# Patient Record
Sex: Male | Born: 1970 | Race: White | Hispanic: No | Marital: Married | Smoking: Never smoker
Health system: Southern US, Community
[De-identification: ages and names within clinical notes are randomized; demographics above are authoritative.]

## PROBLEM LIST (undated history)

## (undated) DIAGNOSIS — K219 Gastro-esophageal reflux disease without esophagitis: Secondary | ICD-10-CM

## (undated) HISTORY — PX: GANGLION CYST EXCISION: SHX1691

## (undated) HISTORY — PX: INGUINAL HERNIA REPAIR: SHX194

## (undated) HISTORY — PX: TENDON REPAIR: SHX5111

## (undated) HISTORY — DX: Gastro-esophageal reflux disease without esophagitis: K21.9

---

## 1998-11-24 ENCOUNTER — Emergency Department (HOSPITAL_COMMUNITY): Admission: EM | Admit: 1998-11-24 | Discharge: 1998-11-24 | Payer: Self-pay | Admitting: Emergency Medicine

## 1999-01-01 ENCOUNTER — Ambulatory Visit (HOSPITAL_COMMUNITY): Admission: RE | Admit: 1999-01-01 | Discharge: 1999-01-01 | Payer: Self-pay | Admitting: Internal Medicine

## 1999-01-01 ENCOUNTER — Encounter: Payer: Self-pay | Admitting: Internal Medicine

## 2000-06-06 ENCOUNTER — Emergency Department (HOSPITAL_COMMUNITY): Admission: EM | Admit: 2000-06-06 | Discharge: 2000-06-06 | Payer: Self-pay | Admitting: Emergency Medicine

## 2006-03-19 ENCOUNTER — Ambulatory Visit: Payer: Self-pay | Admitting: Family Medicine

## 2006-03-23 ENCOUNTER — Emergency Department (HOSPITAL_COMMUNITY): Admission: EM | Admit: 2006-03-23 | Discharge: 2006-03-24 | Payer: Self-pay | Admitting: Emergency Medicine

## 2008-09-20 ENCOUNTER — Ambulatory Visit: Payer: Self-pay | Admitting: Family Medicine

## 2010-07-08 ENCOUNTER — Ambulatory Visit: Payer: Self-pay | Admitting: Family Medicine

## 2010-07-22 ENCOUNTER — Ambulatory Visit: Payer: Self-pay | Admitting: Family Medicine

## 2010-09-05 ENCOUNTER — Ambulatory Visit: Admit: 2010-09-05 | Payer: Self-pay | Admitting: Family Medicine

## 2010-09-05 ENCOUNTER — Encounter (INDEPENDENT_AMBULATORY_CARE_PROVIDER_SITE_OTHER): Payer: 59 | Admitting: Family Medicine

## 2010-09-05 DIAGNOSIS — Z0000 Encounter for general adult medical examination without abnormal findings: Secondary | ICD-10-CM

## 2010-09-05 DIAGNOSIS — F430 Acute stress reaction: Secondary | ICD-10-CM

## 2012-01-02 ENCOUNTER — Encounter: Payer: Self-pay | Admitting: Medical

## 2012-01-02 ENCOUNTER — Ambulatory Visit (INDEPENDENT_AMBULATORY_CARE_PROVIDER_SITE_OTHER): Payer: 59 | Admitting: Medical

## 2012-01-02 VITALS — BP 98/60 | HR 76 | Temp 98.4°F | Resp 16 | Wt 162.0 lb

## 2012-01-02 DIAGNOSIS — M5430 Sciatica, unspecified side: Secondary | ICD-10-CM

## 2012-01-02 DIAGNOSIS — M5431 Sciatica, right side: Secondary | ICD-10-CM

## 2012-01-02 MED ORDER — METHYLPREDNISOLONE 4 MG PO KIT: PACK | ORAL | Status: AC

## 2012-01-02 MED ORDER — HYDROCODONE-ACETAMINOPHEN 5-500 MG PO TABS: 1.0000 | ORAL_TABLET | Freq: Four times a day (QID) | ORAL | Status: AC | PRN

## 2012-01-02 NOTE — Patient Instructions (Signed)

## 2012-01-02 NOTE — Progress Notes (Signed)
Subjective: Here for "sciatic nerve pain."  He notes ever once and a while the nerve gets pinched and pain will shoot down to the feet.  This episode started last week, seemed to be ok over the weekend, but last few days flared up again. This has been an issue for years.  Seems to have worsened over the years.  This morning the whole right leg was jumping.  The pain in general this morning was so bad he felt nauseated.  He note hx/o 2 degenerative discs in lower back.  Has seen one of the orthopedics in town for this in past.  Last lumbar xrays 7 years ago. Had MRI 7 years ago.  When the pain flares up, he walks and keeps moving, working.  Using Tylenol, Excedrin, and in the last 2 days switched to Aleve.  Yesterday took a whole small bottle of Excedrin.  Walks all the time for exercise, is active, hunts, fishes, goes for 6mi walks at a time.  Stretches some in the mornings and at night.  Walking does aggravate the pain.    Review of Systems Constitutional: -fever, -chills, -sweats, -unexpected -weight change,-fatigue Gastroenterology: -abdominal pain, -nausea, -vomiting, -diarrhea, -constipation  Musculoskeletal: -arthralgias, -myalgias, -joint swelling Urology: -dysuria, -difficulty urinating, -hematuria, -urinary frequency, -urgency Neurology: -headache, -weakness, -tingling, -numbness, no bowel or bladder incontinence    Objective:   Physical Exam  Filed Vitals:   01/02/12 0851  BP: 98/60  Pulse: 76  Temp: 98.4 F (36.9 C)  Resp: 16    General appearance: alert, no distress, WD/WN, lean white male  Abdomen: +bs, soft, non tender, non distended, no masses, no hepatomegaly, no splenomegaly Pulses: 2+ symmetric, upper and lower extremities, normal cap refill Neuro: normal strength, sensation, DTRs, and - SLR Back: nontender, mild pain with flexion and extension, no scoliosis, pain radiates down right leg with back ROM MSK: tender along right sciatic notch and back of thigh, otherwise no  joint swelling, nontender, no obvious abnormality, normal LE ROM   Assessment and Plan :    Encounter Diagnosis  Name Primary?  . Sciatica, right Yes   Discussed diagnosis and treatment.  Script today for Medrol Dosepak, Lortab prn, relative rest, and recommended baseline xray but he declines.  Discussed possibly seeing ortho again for possible EDSI or return for further eval and management if this continues.

## 2012-02-24 ENCOUNTER — Other Ambulatory Visit: Payer: Self-pay | Admitting: Medical

## 2012-02-24 ENCOUNTER — Telehealth: Payer: Self-pay | Admitting: Internal Medicine

## 2012-02-24 MED ORDER — DICLOFENAC SODIUM 75 MG PO TBEC: 75.0000 mg | DELAYED_RELEASE_TABLET | Freq: Two times a day (BID) | ORAL | Status: DC

## 2012-02-24 NOTE — Telephone Encounter (Signed)
Patient was notified of the change in his medication. And to follow up with Kristian Covey PA-C if not better. CLS

## 2012-02-24 NOTE — Telephone Encounter (Signed)
Pull chart

## 2012-02-24 NOTE — Telephone Encounter (Signed)
Lets try a different approach.  i sent voltaren to pharmacy that he can begin twice daily for 5-7 days for sciatica flare up.  We discussed last time if not improving or if recurrence to consider other evaluation and management.   Thus, try the Voltaren, and if not improving, recheck.

## 2012-04-15 ENCOUNTER — Encounter: Payer: Self-pay | Admitting: Family Medicine

## 2012-04-15 ENCOUNTER — Ambulatory Visit (INDEPENDENT_AMBULATORY_CARE_PROVIDER_SITE_OTHER): Payer: 59 | Admitting: Family Medicine

## 2012-04-15 VITALS — BP 110/70 | HR 60 | Wt 156.0 lb

## 2012-04-15 DIAGNOSIS — L259 Unspecified contact dermatitis, unspecified cause: Secondary | ICD-10-CM

## 2012-04-15 DIAGNOSIS — M549 Dorsalgia, unspecified: Secondary | ICD-10-CM

## 2012-04-15 MED ORDER — TRAMADOL HCL 50 MG PO TABS: 50.0000 mg | ORAL_TABLET | Freq: Three times a day (TID) | ORAL | Status: AC | PRN

## 2012-04-15 NOTE — Progress Notes (Signed)
  Subjective:    Patient ID: Samuel Duncan, male    DOB: [birth year], 41 y.o.   MRN: [id number]  HPI He developed a rash on his left arm and abdomen 4 days ago. He has been using home remedies minimal success. He also complains of right-sided back pain with radiation down the right leg. He has a previous history of low back pain including having an MRI done approximately 7 or 8 years ago that apparently showed degenerated discs.   Review of Systems     Objective:   Physical Exam Alert and in no distress. Erythematous vesicular lesions noted on the distal left arm and hand. He also has several erythematous lesions present on the left abdominal wall. Exam of his back shows no tenderness to palpation with good motion. Normal hip motion. Negative straight leg raising with normal DTRs.       Assessment & Plan:   1. Contact dermatitis    2. Back pain  traMADol (ULTRAM) 50 MG tablet   recommend conservative care for the contact dermatitis with OTC cortisone preparation. He is to use NSAID first and then use, call if no improvement. He will return here if further difficulty

## 2012-04-15 NOTE — Patient Instructions (Signed)
Use the ibuprofen 4 pills 3 times a day and if you need extra help then the tramadol

## 2012-07-05 ENCOUNTER — Telehealth: Payer: Self-pay | Admitting: Internal Medicine

## 2012-07-05 MED ORDER — TRAMADOL HCL 50 MG PO TABS: 50.0000 mg | ORAL_TABLET | Freq: Three times a day (TID) | ORAL | Status: DC | PRN

## 2012-07-05 NOTE — Telephone Encounter (Signed)
Okay to renew. I did not show up in the medications reconciliation let

## 2012-07-05 NOTE — Telephone Encounter (Signed)
I do not see that we have prescribed tramadol. Check with him on that and possibly set up an appointment to be seen.

## 2012-07-05 NOTE — Telephone Encounter (Signed)
You did back in sept for back pain please advise

## 2012-07-05 NOTE — Telephone Encounter (Signed)
Med sent p[er jcl

## 2012-10-27 ENCOUNTER — Encounter: Payer: Self-pay | Admitting: Medical

## 2012-10-27 ENCOUNTER — Ambulatory Visit (INDEPENDENT_AMBULATORY_CARE_PROVIDER_SITE_OTHER): Payer: 59 | Admitting: Medical

## 2012-10-27 VITALS — BP 100/70 | HR 61 | Temp 98.4°F | Resp 16 | Wt 164.0 lb

## 2012-10-27 DIAGNOSIS — M5442 Lumbago with sciatica, left side: Secondary | ICD-10-CM

## 2012-10-27 DIAGNOSIS — M5430 Sciatica, unspecified side: Secondary | ICD-10-CM

## 2012-10-27 DIAGNOSIS — J019 Acute sinusitis, unspecified: Secondary | ICD-10-CM

## 2012-10-27 MED ORDER — AMOXICILLIN 875 MG PO TABS: 875.0000 mg | ORAL_TABLET | Freq: Two times a day (BID) | ORAL | Status: DC

## 2012-10-27 MED ORDER — TRAMADOL HCL 50 MG PO TABS: 50.0000 mg | ORAL_TABLET | Freq: Three times a day (TID) | ORAL | Status: DC | PRN

## 2012-10-27 MED ORDER — DICLOFENAC SODIUM 75 MG PO TBEC: 75.0000 mg | DELAYED_RELEASE_TABLET | Freq: Two times a day (BID) | ORAL | Status: DC

## 2012-10-27 NOTE — Patient Instructions (Signed)
Respiratory infection/sinus infection  Increase water intake  Begin Mucinex OTC twice daily for 5 days  Use nasal saline flush with bulb syringe - mix 1 tsp in 1 cup warm water to flush the nostril  If not improving by weekend, then begin Amoxicillin  Back:  Use stretching, gentle range of motion activity, Voltaren twice daily for 5-7 days with flare up.  ultram for pain when needed.

## 2012-10-27 NOTE — Progress Notes (Signed)
Subjective:  Here for possible sinus infection.  Been in [place] last 2 weeks, came back Friday.  Weather down there was wonderful, then coming back here to colder weather seemed to trigger something.  Last 3-4 days been having stuff nose, sinus pain, pressure, voice goes in an out, dry throat.  Body aches, ankles, knees and shoulders ache.  Has had chills and low grade fever last few days.  Coughing.  Eyes have been red, but not itchy.   Occasional sneeze.     He notes hx/o 3 sinus infections in past.    Sunday left leg hurting constantly, pain running down the leg.  Just got back from 14 hour road trip, working in [place], back has been flared up since.  No numbness, tingling, weakness.  No bowel or bladder incontinence.  No blood in urine, no fever.  He has had similar prior, seen here twice in last year for same.  Gets occasional flare up.     History reviewed. No pertinent past medical history.  ROS as in subjective  Objective: General appearance: alert, no distress, WD/WN, lean white male  HEENT: sinus tenderness bilat, yellow green thick mucus in nares, TMs pearly, pharynx normal Neck: supple, nontender, no mass, no thyromegaly Lungs: clear Abdomen: +bs, soft, non tender, non distended, no masses, no hepatomegaly, no splenomegaly  Pulses: 2+ symmetric, upper and lower extremities, normal cap refill  Neuro: normal strength, sensation, DTRs, and - SLR normal heel and toe walks Back: mild toe moderate left paraspinal and buttock tenderness, pain mild with flexion and extension, no scoliosis, pain radiates down left leg with back ROM  MSK: tender along left sciatic notch and back of thigh, otherwise no joint swelling, nontender, no obvious abnormality, normal LE ROM   Assessment: Encounter Diagnoses  Name Primary?  . Sinusitis, acute Yes  . Lumbago with sciatica of left side    Plan: Sinusitis - begin nasal saline flush, mucinex, hydration, rest, and if not improving by  Saturday/Sunday, can begin amoxicillin  Lumbago - acute and chronic.  Discussed stretches, ROM exercise, heat, relative rest, scripts as below

## 2013-02-07 ENCOUNTER — Encounter: Payer: Self-pay | Admitting: Medical

## 2013-02-07 ENCOUNTER — Ambulatory Visit (INDEPENDENT_AMBULATORY_CARE_PROVIDER_SITE_OTHER): Payer: 59 | Admitting: Medical

## 2013-02-07 VITALS — BP 90/60 | HR 68 | Temp 98.2°F | Resp 16 | Wt 158.0 lb

## 2013-02-07 DIAGNOSIS — L0100 Impetigo, unspecified: Secondary | ICD-10-CM

## 2013-02-07 DIAGNOSIS — T148 Other injury of unspecified body region: Secondary | ICD-10-CM

## 2013-02-07 DIAGNOSIS — W57XXXA Bitten or stung by nonvenomous insect and other nonvenomous arthropods, initial encounter: Secondary | ICD-10-CM

## 2013-02-07 MED ORDER — MUPIROCIN 2 % EX OINT: TOPICAL_OINTMENT | Freq: Three times a day (TID) | CUTANEOUS | Status: DC

## 2013-02-07 NOTE — Progress Notes (Signed)
Subjective: Here for possible tick bite infected.  pulled tick out from belly button Friday 3 days ago.  Was in deep.  Used fishing forceps to pull it out.   Not its red, has some pus coming out.  Area has been itchy as well.  Denies fever, no other rash, no arthralgias or myalgias otherwise.  Using nothing else for symptoms .  No other aggravating or relieving factors.    No past medical history on file.  ROS as in subjective  Objective: Gen: wd, wn, nad Skin: inferior portion of inner part of umbilicus with mild erythema, some crusted serous drainage, no obvious foreign body, there is a small wound suggestive of recent insect bite   Assessment: Encounter Diagnoses  Name Primary?  . Tick bite Yes  . Impetigo    Plan: Discussed cleaning the wound, drying the wound after shower, and using Mupirocin on cotton applicator TID x 1 wk.   discussed signs of cellulitis or worsening infection.  Call/return if worse or not improving.

## 2013-05-10 ENCOUNTER — Ambulatory Visit (INDEPENDENT_AMBULATORY_CARE_PROVIDER_SITE_OTHER): Payer: 59 | Admitting: Family Medicine

## 2013-05-10 ENCOUNTER — Encounter: Payer: Self-pay | Admitting: Family Medicine

## 2013-05-10 VITALS — BP 116/70 | HR 58 | Wt 159.0 lb

## 2013-05-10 DIAGNOSIS — M549 Dorsalgia, unspecified: Secondary | ICD-10-CM

## 2013-05-10 DIAGNOSIS — Z23 Encounter for immunization: Secondary | ICD-10-CM

## 2013-05-10 DIAGNOSIS — N4889 Other specified disorders of penis: Secondary | ICD-10-CM

## 2013-05-10 DIAGNOSIS — N489 Disorder of penis, unspecified: Secondary | ICD-10-CM

## 2013-05-10 NOTE — Progress Notes (Signed)
  Subjective:    Patient ID: Samuel Duncan, male    DOB: [birth year], 42 y.o.   MRN: [id number]  HPI He has a one-week history of low back pain especially on the left with radiation down his leg. No numbness, tingling or weakness but he does state it hurts when he walks. He also notes that whether changes can cause this. He also complains of redness at the urethral meatus. He noted this after sexual activity but no condom. It also occurred after he bought a new pair of boxers that did cause some irritation in that area    Review of Systems     Objective:   Physical Exam Alert and in no distress. Normal lumbar curve. No tenderness palpation over the SI joints or lumbar spine. Negative straight leg raising. Normal DTRs. He does point to the SI joint area on the left as to where he feels most discomfort when he hasn't. Exam of his penis does show a slightly irritated area in the urethral meatus the 10:00 position.       Assessment & Plan:  Need for prophylactic vaccination and inoculation against influenza  Penile lesion  Back pain  flu shot given with risks and benefits discussed. Recommend conservative care for the penile lesion. This is most likely from irritation. Discussed treatment of his back pain and recommend he return here when he has symptoms. This could easily be sacroiliitis

## 2013-05-10 NOTE — Patient Instructions (Addendum)
Keep doing stretching and range of motion and Billyjack't hesitate to take Advil or Aleve. The next time you have symptoms, come in for evaluation.

## 2013-08-11 ENCOUNTER — Encounter: Payer: Self-pay | Admitting: Family Medicine

## 2013-08-11 ENCOUNTER — Ambulatory Visit (INDEPENDENT_AMBULATORY_CARE_PROVIDER_SITE_OTHER): Payer: 59 | Admitting: Family Medicine

## 2013-08-11 VITALS — BP 102/70 | HR 75 | Wt 161.0 lb

## 2013-08-11 DIAGNOSIS — M549 Dorsalgia, unspecified: Secondary | ICD-10-CM

## 2013-08-11 MED ORDER — DICLOFENAC SODIUM 75 MG PO TBEC: 75.0000 mg | DELAYED_RELEASE_TABLET | Freq: Two times a day (BID) | ORAL | Status: AC

## 2013-08-11 NOTE — Patient Instructions (Signed)
Keep doing your heat, stretching and range of motion

## 2013-08-11 NOTE — Progress Notes (Signed)
   Subjective:    Patient ID: Samuel Duncan, male    DOB: March 20, 1971, 43 y.o.   MRN: [id number]  HPI He is here for evaluation of back pain. He has a long history of difficulty with back pain dating to his teenage years. In the past he apparently did have an MRI which did show some degenerative changes but it was recommended against doing surgery.  His latest episode started yesterday morning. He does get a tingling sensation in the left lateral leg area but no weakness or numbness. He has not taken any medications. In the past he has used to diclofenac with good results.   Review of Systems     Objective:   Physical Exam Alert and in no distress. No tenderness to palpation over the SI spine, lumbar spine or sciatic notch. Normal lumbar motion. Normal lumbar curve. Negative straight leg raising normal motor, sensory and DTRs. Normal hip and knee motion.      Assessment & Plan:  Back pain - Plan: diclofenac (VOLTAREN) 75 MG EC tablet  continue with conservative care. Recommend heat, stretching and range of motion exercises. He states that he has been doing this and I encouraged him to continue.

## 2014-02-14 ENCOUNTER — Encounter: Payer: 59 | Admitting: Family Medicine

## 2014-02-15 ENCOUNTER — Encounter: Payer: Self-pay | Admitting: Family Medicine

## 2014-02-15 ENCOUNTER — Ambulatory Visit (INDEPENDENT_AMBULATORY_CARE_PROVIDER_SITE_OTHER): Payer: 59 | Admitting: Family Medicine

## 2014-02-15 VITALS — BP 128/76 | HR 80 | Ht 72.0 in | Wt 155.0 lb

## 2014-02-15 DIAGNOSIS — G8929 Other chronic pain: Secondary | ICD-10-CM

## 2014-02-15 DIAGNOSIS — M549 Dorsalgia, unspecified: Secondary | ICD-10-CM

## 2014-02-15 DIAGNOSIS — M5430 Sciatica, unspecified side: Secondary | ICD-10-CM

## 2014-02-15 DIAGNOSIS — M5431 Sciatica, right side: Secondary | ICD-10-CM

## 2014-02-15 DIAGNOSIS — Z0000 Encounter for general adult medical examination without abnormal findings: Secondary | ICD-10-CM

## 2014-02-15 LAB — CBC WITH DIFFERENTIAL/PLATELET
BASOS PCT: 1 % (ref 0–1)
Basophils Absolute: 0.1 10*3/uL (ref 0.0–0.1)
EOS ABS: 0.1 10*3/uL (ref 0.0–0.7)
EOS PCT: 1 % (ref 0–5)
HCT: 41.5 % (ref 39.0–52.0)
Hemoglobin: 15.3 g/dL (ref 13.0–17.0)
LYMPHS ABS: 2.8 10*3/uL (ref 0.7–4.0)
Lymphocytes Relative: 27 % (ref 12–46)
MCH: 31 pg (ref 26.0–34.0)
MCHC: 36.9 g/dL — AB (ref 30.0–36.0)
MCV: 84 fL (ref 78.0–100.0)
MONOS PCT: 5 % (ref 3–12)
Monocytes Absolute: 0.5 10*3/uL (ref 0.1–1.0)
NEUTROS PCT: 66 % (ref 43–77)
Neutro Abs: 6.9 10*3/uL (ref 1.7–7.7)
PLATELETS: 311 10*3/uL (ref 150–400)
RBC: 4.94 MIL/uL (ref 4.22–5.81)
RDW: 13.4 % (ref 11.5–15.5)
WBC: 10.5 10*3/uL (ref 4.0–10.5)

## 2014-02-15 LAB — COMPREHENSIVE METABOLIC PANEL
ALK PHOS: 102 U/L (ref 39–117)
ALT: 15 U/L (ref 0–53)
AST: 25 U/L (ref 0–37)
Albumin: 4.9 g/dL (ref 3.5–5.2)
BILIRUBIN TOTAL: 1.5 mg/dL — AB (ref 0.2–1.2)
BUN: 16 mg/dL (ref 6–23)
CO2: 27 meq/L (ref 19–32)
CREATININE: 1.07 mg/dL (ref 0.50–1.35)
Calcium: 9.8 mg/dL (ref 8.4–10.5)
Chloride: 102 mEq/L (ref 96–112)
GLUCOSE: 78 mg/dL (ref 70–99)
Potassium: 4.2 mEq/L (ref 3.5–5.3)
Sodium: 139 mEq/L (ref 135–145)
Total Protein: 7.7 g/dL (ref 6.0–8.3)

## 2014-02-15 LAB — HEMOCCULT GUIAC POC 1CARD (OFFICE)

## 2014-02-15 LAB — LIPID PANEL
CHOL/HDL RATIO: 3.2 ratio
CHOLESTEROL: 128 mg/dL (ref 0–200)
HDL: 40 mg/dL (ref >39)
LDL CALC: 53 mg/dL (ref 0–99)
TRIGLYCERIDES: 175 mg/dL — AB (ref <150)
VLDL: 35 mg/dL (ref 0–40)

## 2014-02-15 NOTE — Patient Instructions (Signed)
Do the heat, stretching and add Advil regularly for the next 2 weeks. It's 4 Advil 3 times per day

## 2014-02-15 NOTE — Progress Notes (Signed)
   Subjective:    Patient ID: Samuel Duncan, male    DOB: 1970/09/17, 43 y.o.   MRN: [id number]  HPI He is here for complete examination. He continues to have intermittent back pain. The most recent episode has been bothering him for the last 3 or 4 weeks . He apparently has had an MRI done in the past and he has also been through physical therapy. He does state that the pain radiates distal and proximal. He has no other concerns or complaints. His home life is going well. He has to 44 year old children. He has a new job and is excited about this. Social and family history are unchanged.   Review of Systems  All other systems reviewed and are negative.      Objective:   Physical Exam BP 128/76  Pulse 80  Ht 6' (1.829 m)  Wt 155 lb (70.308 kg)  BMI 21.02 kg/m2  General Appearance:    Alert, cooperative, no distress, appears stated age  Head:    Normocephalic, without obvious abnormality, atraumatic  Eyes:    PERRL, conjunctiva/corneas clear, EOM's intact, fundi    benign  Ears:    Normal TM's and external ear canals  Nose:   Nares normal, mucosa normal, no drainage or sinus   tenderness  Throat:   Lips, mucosa, and tongue normal; teeth and gums normal  Neck:   Supple, no lymphadenopathy;  thyroid:  no   enlargement/tenderness/nodules; no carotid   bruit or JVD  Back:    Spine nontender, no curvature, ROM normal, no CVA     tenderness  Lungs:     Clear to auscultation bilaterally without wheezes, rales or     ronchi; respirations unlabored  Chest Wall:    No tenderness or deformity   Heart:    Regular rate and rhythm, S1 and S2 normal, no murmur, rub   or gallop  Breast Exam:    No chest wall tenderness, masses or gynecomastia  Abdomen:     Soft, non-tender, nondistended, normoactive bowel sounds,    no masses, no hepatosplenomegaly  Genitalia:    Normal male external genitalia without lesions.  Testicles without masses.  No inguinal hernias.  Rectal:    Normal sphincter  tone, no masses or tenderness; guaiac negative stool.  Prostate smooth, no nodules, not enlarged.  Extremities:   No clubbing, cyanosis or edema.normal lumbar curve. Normal lumbar motion. No tenderness to palpation over SI joints. Tender to palpation over the right sciatic notch. Negative straight leg raising. Normal hip motion.   Pulses:   2+ and symmetric all extremities  Skin:   Skin color, texture, turgor normal, no rashes or lesions  Lymph nodes:   Cervical, supraclavicular, and axillary nodes normal  Neurologic:   CNII-XII intact, normal strength, sensation and gait; reflexes 2+ and symmetric throughout          Psych:   Normal mood, affect, hygiene and grooming.          Assessment & Plan:  Routine general medical examination at a health care facility - Plan: CBC with Differential, Comprehensive metabolic panel, Lipid panel, POCT occult blood stool  Chronic back pain - Plan: Ambulatory referral to Physical Therapy  Sciatica, right - Plan: Ambulatory referral to Physical Therapy  also recommend heat, stretching and anti-inflammatory of choice. I did explain the diagnosis to him. Recommend long-term conservative care.

## 2014-05-13 ENCOUNTER — Telehealth: Payer: Self-pay | Admitting: Family Medicine

## 2014-05-13 NOTE — Telephone Encounter (Signed)
lm

## 2016-03-06 ENCOUNTER — Encounter: Payer: Self-pay | Admitting: Family Medicine

## 2016-03-06 ENCOUNTER — Ambulatory Visit (INDEPENDENT_AMBULATORY_CARE_PROVIDER_SITE_OTHER): Payer: 59 | Admitting: Family Medicine

## 2016-03-06 VITALS — BP 114/60 | HR 65 | Wt 168.0 lb

## 2016-03-06 DIAGNOSIS — R490 Dysphonia: Secondary | ICD-10-CM | POA: Diagnosis not present

## 2016-03-06 NOTE — Progress Notes (Signed)
   Subjective:    Patient ID: Samuel Duncan, male    DOB: [birth year], 45 y.o.   MRN: [id number]  HPI He complains of a five-week history of hoarse voice that intermittently is lost. He's had no fever, chills, sore throat, coughing. No history of allergies with sneezing, itchy watery eyes.   Review of Systems     Objective:   Physical Exam Alert and in no distress. Tympanic membranes and canals are normal. Pharyngeal area is normal. Neck is supple without adenopathy or thyromegaly. Cardiac exam shows a regular sinus rhythm without murmurs or gallops. Lungs are clear to auscultation.        Assessment & Plan:  Hoarse voice quality - Plan: Ambulatory referral to ENT Since his voices consistently hoarse and intermittently he loses it completely, I think ENT evaluation is appropriate.

## 2016-03-17 DIAGNOSIS — K219 Gastro-esophageal reflux disease without esophagitis: Secondary | ICD-10-CM

## 2016-03-17 HISTORY — DX: Gastro-esophageal reflux disease without esophagitis: K21.9

## 2016-10-05 DIAGNOSIS — K3580 Unspecified acute appendicitis: Secondary | ICD-10-CM | POA: Diagnosis not present

## 2017-03-31 DIAGNOSIS — R490 Dysphonia: Secondary | ICD-10-CM | POA: Diagnosis not present

## 2017-03-31 DIAGNOSIS — K219 Gastro-esophageal reflux disease without esophagitis: Secondary | ICD-10-CM | POA: Diagnosis not present

## 2017-04-22 ENCOUNTER — Ambulatory Visit (INDEPENDENT_AMBULATORY_CARE_PROVIDER_SITE_OTHER): Payer: 59 | Admitting: Family Medicine

## 2017-04-22 ENCOUNTER — Encounter: Payer: Self-pay | Admitting: Gastroenterology

## 2017-04-22 ENCOUNTER — Telehealth: Payer: Self-pay | Admitting: Family Medicine

## 2017-04-22 ENCOUNTER — Encounter: Payer: Self-pay | Admitting: Family Medicine

## 2017-04-22 VITALS — BP 110/60 | HR 68 | Resp 18 | Wt 175.8 lb

## 2017-04-22 DIAGNOSIS — K2100 Gastro-esophageal reflux disease with esophagitis, without bleeding: Secondary | ICD-10-CM

## 2017-04-22 DIAGNOSIS — W57XXXA Bitten or stung by nonvenomous insect and other nonvenomous arthropods, initial encounter: Secondary | ICD-10-CM | POA: Diagnosis not present

## 2017-04-22 MED ORDER — DEXLANSOPRAZOLE 30 MG PO CPDR: 30.0000 mg | DELAYED_RELEASE_CAPSULE | Freq: Every day | ORAL | 1 refills | Status: DC

## 2017-04-22 MED ORDER — ESOMEPRAZOLE MAGNESIUM 40 MG PO CPDR: 40.0000 mg | DELAYED_RELEASE_CAPSULE | Freq: Every day | ORAL | 3 refills | Status: DC

## 2017-04-22 NOTE — Progress Notes (Signed)
   Subjective:    Patient ID: Samuel Duncan, male    DOB: 1971-08-01, 46 y.o.   MRN: [id number]  HPI He is here for consult concerning continued difficulty with reflux symptoms. He was seen recently by Dr. Annalee Genta and an indirect laryngoscopy did show mild posterior glottic erythema. He was instructed on proper care to reduce reflux symptoms and he states that he has been very vigilant about this including raising the head of his bed. He has been taking Prilosec but still apparently having intermittent difficulty. The suggestion was made for GI referral for possible endoscopy. He also states he was bitten by a tick in the pubic area proximally one month ago and still sees 2 very small lesions. His had no fever, chills, rash in that area.  Review of Systems     Objective:   Physical Exam Alert and in no distress exam of the pubic area shows 2 very small healing lesions in the left pubic area. No evidence of erythema migrans     Assessment & Plan:  Gastroesophageal reflux disease with esophagitis - Plan: esomeprazole (NEXIUM) 40 MG capsule, Ambulatory referral to Gastroenterology  Tick bite, initial encounter I reviewed the note from Dr. Annalee Genta with the patient and reinforced all the recommendations mentioned in his record. The patient states that he has been very vigilant in doing this. I will switch him to Nexium to see if that has any positive effect and refer to GI for possible endoscopy Reassured him that I did not think the tick bite was of any major concerns since he is really having no symptoms.

## 2017-04-22 NOTE — Telephone Encounter (Signed)
Rcvd note from pharmacy stating that Esomeprazole 40 mg is not covered by pt's insurance. Pharmacy requesting that script be change to an alternate med. Suggested alternatives are Omeprazole DR 40 mg, Pantoprazole DR 40 mg, Rabeprazole DR 20 mg, Dexilant DR 30 mg

## 2017-04-22 NOTE — Telephone Encounter (Signed)
Called pt and made him aware of medication change. Trixie Rude

## 2017-04-22 NOTE — Telephone Encounter (Signed)
Change him to Dexalant 

## 2017-06-12 ENCOUNTER — Encounter: Payer: Self-pay | Admitting: Gastroenterology

## 2017-06-12 ENCOUNTER — Ambulatory Visit: Payer: 59 | Admitting: Gastroenterology

## 2017-06-12 ENCOUNTER — Encounter: Payer: Self-pay | Admitting: Physician Assistant

## 2017-06-12 ENCOUNTER — Ambulatory Visit: Payer: 59 | Admitting: Physician Assistant

## 2017-06-12 VITALS — BP 98/66 | HR 76 | Ht 71.25 in | Wt 173.1 lb

## 2017-06-12 DIAGNOSIS — K219 Gastro-esophageal reflux disease without esophagitis: Secondary | ICD-10-CM

## 2017-06-12 DIAGNOSIS — F458 Other somatoform disorders: Secondary | ICD-10-CM | POA: Diagnosis not present

## 2017-06-12 DIAGNOSIS — K5900 Constipation, unspecified: Secondary | ICD-10-CM

## 2017-06-12 DIAGNOSIS — R0989 Other specified symptoms and signs involving the circulatory and respiratory systems: Secondary | ICD-10-CM

## 2017-06-12 DIAGNOSIS — R490 Dysphonia: Secondary | ICD-10-CM | POA: Diagnosis not present

## 2017-06-12 DIAGNOSIS — R198 Other specified symptoms and signs involving the digestive system and abdomen: Secondary | ICD-10-CM

## 2017-06-12 MED ORDER — OMEPRAZOLE 40 MG PO CPDR: 40.0000 mg | DELAYED_RELEASE_CAPSULE | Freq: Two times a day (BID) | ORAL | 3 refills | Status: DC

## 2017-06-12 NOTE — Progress Notes (Signed)
Thanks Candise BowensJen,  I agree with your note, plan

## 2017-06-12 NOTE — Progress Notes (Signed)
Chief Complaint: GERD, constipation, hoarseness  HPI:    Samuel Duncan is a 46 year old Caucasian male with a past medical history as listed below, who was referred to me by Ronnald Nian, MD for a complaint of GERD, hoarseness and constipation.      Review of chart shows that patient did see Dr. Annalee Genta with ENT.  Patient had a laryngoscopy on 03/31/17 which showed mild post-glottic erythema consistent with reflux.  At that time, he was given reflux precautions and restarted on PPI therapy.    Today, the patient presents clinic and tells me over the past year he has had trouble with hoarseness and losing his voice "here and there".  Patient describes that sometimes he will wake up and it will just "be gone".  Patient notes that he never tastes acid in his mouth, but does feel very full after eating a small amount of food and also sometimes feels as though "something is in my throat".  Patient describes that he was initially started on Omeprazole which did not seem to be helping so he stopped this.  He restarted this after seeing Dr. Annalee Genta in August.  Patient currently is taking Dexilant 30 mg once daily in the morning and Omeprazole 40 mg once daily at night.  Patient tells me that his regular routine is getting hungry about 10:30 in the morning and when he goes to eat he will only be able to eat about 3 bites of food before feeling full.  About 45 minutes later the patient tells me that he is "starving".  Patient has been abiding by antireflux recommendations including raising the head of his bed and altering his diet but nothing has seemed to change his symptoms.  Patient denies any weight loss.    Patient also describes that his bowel movements have changed over the past year and they look like "a goat backed up to my toilet and pooped".  Patient describes that he never gets a long solid bowel movement.  He has not tried anything for this yet.    Patient denies fever, chills,melena, anorexia,  nausea, vomiting or symptoms that awaken him at night.  Past Medical History:  Diagnosis Date  . GERD (gastroesophageal reflux disease) 03/17/2016   [facility] ENT Notes     Past Surgical History:  Procedure Laterality Date  . GANGLION CYST EXCISION Left    wrist  . INGUINAL HERNIA REPAIR Right   . TENDON REPAIR Right    middle finger    Current Outpatient Medications  Medication Sig Dispense Refill  . Dexlansoprazole (DEXILANT) 30 MG capsule Take 1 capsule (30 mg total) by mouth daily. 30 capsule 1  . omeprazole (PRILOSEC) 40 MG capsule Take 40 mg by mouth daily.     No current facility-administered medications for this visit.     Allergies as of 06/12/2017  . (No Known Allergies)    Family History  Problem Relation Age of Onset  . Heart disease Mother   . Dementia Mother   . Hypertension Father   . Migraines Sister   . Breast cancer Maternal Grandmother   . Heart disease Maternal Grandfather   . Other Paternal Grandmother        brain tumor  . Alcoholism Paternal Grandfather   . Diabetes Maternal Aunt   . Hypertension Maternal Aunt   . Hypertension Paternal Aunt   . Breast cancer Maternal Aunt   . Throat cancer Paternal Uncle   . Dementia Paternal Aunt  x 2  . Alzheimer's disease Paternal Uncle     Social History   Socioeconomic History  . Marital status: Married    Spouse name: Not on file  . Number of children: 2  . Years of education: Not on file  . Highest education level: Not on file  Social Needs  . Financial resource strain: Not on file  . Food insecurity - worry: Not on file  . Food insecurity - inability: Not on file  . Transportation needs - medical: Not on file  . Transportation needs - non-medical: Not on file  Occupational History  . Occupation: Engineer, [employer] manager  Tobacco Use  . Smoking status: Never Smoker  . Smokeless tobacco: Never Used  Substance and Sexual Activity  . Alcohol use: No  . Drug use: No  . Sexual  activity: No  Other Topics Concern  . Not on file  Social History Narrative  . Not on file    Review of Systems:    Constitutional: No weight loss, fever or chills Skin: No rash Cardiovascular: No chest pain  Respiratory: No SOB Gastrointestinal: See HPI and otherwise negative Genitourinary: No dysuria Neurological: No headache Musculoskeletal: No new muscle or joint pain Hematologic: No bleeding or bruising Psychiatric: No history of depression or anxiety    Physical Exam:  Vital signs: BP 98/66   Pulse 76   Ht 5' 11.25" (1.81 m)   Wt 173 lb 2 oz (78.5 kg)   BMI 23.98 kg/m   Constitutional:   Very pleasant Caucasian male appears to be in NAD, Well developed, Well nourished, alert and cooperative Head:  Normocephalic and atraumatic. Eyes:   PEERL, EOMI. No icterus. Conjunctiva pink. Ears:  Normal auditory acuity. Neck:  Supple Throat: Oral cavity and pharynx without inflammation, swelling or lesion.  Respiratory: Respirations even and unlabored. Lungs clear to auscultation bilaterally.   No wheezes, crackles, or rhonchi.  Cardiovascular: Normal S1, S2. No MRG. Regular rate and rhythm. No peripheral edema, cyanosis or pallor.  Gastrointestinal:  Soft, nondistended, nontender. No rebound or guarding. Normal bowel sounds. No appreciable masses or hepatomegaly. Rectal:  Not performed.  Msk:  Symmetrical without gross deformities. Without edema, no deformity or joint abnormality.  Neurologic:  Alert and  oriented x4;  grossly normal neurologically.  Skin:   Dry and intact without significant lesions or rashes. Psychiatric: Demonstrates good judgement and reason without abnormal affect or behaviors.  No recent labs.  Assessment: 1.  GERD: Witnessed via laryngoscopy in August, patient with intermittent hoarseness, early satiety and a globus sensation; consider GERD versus esophagitis versus gastritis 2.  Hoarseness: With above 3.  Constipation: Over the past year, "goat  poop", consider relation to gastritis and decrease in diet with early satiety 4.  Globus sensation: Likely esophagitis or stricture  Plan: 1.  Scheduled patient for an EGD with [facility]ella [facility].  Discussed risk, benefits, limitations and alternatives and the patient agrees to proceed. 2.  Reviewed anti-dysphagia measures including taking small bites, drinks sips of water between bites and a chin tuck technique. 3.  Recommend patient discontinue his Dexilant.  Increased his Omeprazole to 40 mg twice a day, 30-60 minutes before eating breakfast and dinner. 4.  Reviewed antireflux diet and lifestyle modifications.  Provide patient with a handout. 5.  Recommend the patient increase fiber in his diet to at least 25-35 g/day with the use of a fiber supplement and/or through his diet and provided him a handout. 6.  Recommend patient increase  water intake to the 6-8 eight ounce glasses of water per day. 6.  Recommend the patient start a daily laxative such as MiraLAX. 7.  Patient to follow-up clinic per recommendations after time of EGD with Dr. Christella HartiganJacobs.  Hyacinth MeekerJennifer Lemmon, PA-C [facility] 06/12/2017, 10:34 AM  Cc: Ronnald NianLalonde, John C, MD

## 2017-06-12 NOTE — Patient Instructions (Signed)
You have been scheduled for an endoscopy. Please follow written instructions given to you at your visit today. If you use inhalers (even only as needed), please bring them with you on the day of your procedure. Your physician has requested that you go to www.startemmi.com and enter the access code given to you at your visit today. This web site gives a general overview about your procedure. However, you should still follow specific instructions given to you by our office regarding your preparation for the procedure.  Stop Dexilant.   Increase Omeprazole to 40 mg twice a day.   Please purchase the following medications over the counter and take as directed: Miralax once day  We have given you a GERD handout. Please strive to adhere to these dietary guidelines.   We have given you a high fiber diet handout. Please strive to have 25-30 grams of fiber daily.   Your provider suggest that you drink more water. Try to have at least 6-8 8 oz glasses of water daily.

## 2017-06-22 NOTE — Progress Notes (Signed)
No show

## 2017-06-25 ENCOUNTER — Other Ambulatory Visit: Payer: Self-pay | Admitting: Family Medicine

## 2017-07-01 ENCOUNTER — Encounter: Payer: Self-pay | Admitting: Family Medicine

## 2017-07-01 ENCOUNTER — Ambulatory Visit: Payer: 59 | Admitting: Family Medicine

## 2017-07-01 VITALS — BP 110/70 | HR 68 | Ht 73.0 in | Wt 174.0 lb

## 2017-07-01 DIAGNOSIS — K2100 Gastro-esophageal reflux disease with esophagitis, without bleeding: Secondary | ICD-10-CM

## 2017-07-01 DIAGNOSIS — G5701 Lesion of sciatic nerve, right lower limb: Secondary | ICD-10-CM | POA: Diagnosis not present

## 2017-07-01 DIAGNOSIS — Z23 Encounter for immunization: Secondary | ICD-10-CM

## 2017-07-01 MED ORDER — METAXALONE 800 MG PO TABS: 400.0000 mg | ORAL_TABLET | Freq: Three times a day (TID) | ORAL | 0 refills | Status: DC | PRN

## 2017-07-01 NOTE — Patient Instructions (Signed)
Increase Aleve to two tablets twice daily, taking with food. You can take this for up to 10-14 days. Continue your omeprazole.  Do not take ibuprofen, Goody/BC, motrin or any other pain medication. You MAY take acetaminophen (Tylenol) along with the aleve, if needed. Use the skelaxin up to 3 times daily.  Some people it may make a little drowsy, so use caution if taking during the day and driving.   Continue to do the pyriformis stretches and shown.  Your have inflammation at your SI joint and pyriformis muscle, which is pinching the sciatic nerve. Sometimes the chiropractor can make this better faster than time or physical therapy alone.  PT is definitely an option if the anti-inflammatory, muscle relaxant and stretches alone are not helping.  If you develop numbness, tingling, weakness, problems controlling your bowels or bladder, you need to be seen right away.  I recommend having routine bloodwork due to evalute for nutritional deficiencies related to longterm PPI (omeprazole use), since you are not taking regular supplements/vitamins.  I usually check blood counts, Magnesium periodically.  I highly recommend that you work on core strengthening (back, abdominal muscles) and flexibility to help prevent future back problems.     Sacroiliac Joint Dysfunction Sacroiliac joint dysfunction is a condition that causes inflammation on one or both sides of the sacroiliac (SI) joint. The SI joint connects the lower part of the spine (sacrum) with the two upper portions of the pelvis (ilium). This condition causes deep aching or burning pain in the low back. In some cases, the pain may also spread into one or both buttocks or hips or spread down the legs. What are the causes? This condition may be caused by:  Pregnancy. During pregnancy, extra stress is put on the SI joints because the pelvis widens.  Injury, such as: ? Car accidents. ? Sport-related injuries. ? Work-related injuries.  Having one  leg that is shorter than the other.  Conditions that affect the joints, such as: ? Rheumatoid arthritis. ? Gout. ? Psoriatic arthritis. ? Joint infection (septic arthritis).  Sometimes, the cause of SI joint dysfunction is not known. What are the signs or symptoms? Symptoms of this condition include:  Aching or burning pain in the lower back. The pain may also spread to other areas, such as: ? Buttocks. ? Groin. ? Thighs and legs.  Muscle spasms in or around the painful areas.  Increased pain when standing, walking, running, stair climbing, bending, or lifting.  How is this diagnosed? Your health care provider will do a physical exam and take your medical history. During the exam, the health care provider may move one or both of your legs to different positions to check for pain. Various tests may be done to help verify the diagnosis, including:  Imaging tests to look for other causes of pain. These may include: ? MRI. ? CT scan. ? Bone scan.  Diagnostic injection. A numbing medicine is injected into the SI joint using a needle. If the pain is temporarily improved or stopped after the injection, this can indicate that SI joint dysfunction is the problem.  How is this treated? Treatment may vary depending on the cause and severity of your condition. Treatment options may include:  Applying ice or heat to the lower back area. This can help to reduce pain and muscle spasms.  Medicines to relieve pain or inflammation or to relax the muscles.  Wearing a back brace (sacroiliac brace) to help support the joint while your back is  healing.  Physical therapy to increase muscle strength around the joint and flexibility at the joint. This may also involve learning proper body positions and ways of moving to relieve stress on the joint.  Direct manipulation of the SI joint.  Injections of steroid medicine into the joint in order to reduce pain and swelling.  Radiofrequency ablation  to burn away nerves that are carrying pain messages from the joint.  Use of a device that provides electrical stimulation in order to reduce pain at the joint.  Surgery to put in screws and plates that limit or prevent joint motion. This is rare.  Follow these instructions at home:  Rest as needed. Limit your activities as directed by your health care provider.  Take medicines only as directed by your health care provider.  If directed, apply ice to the affected area: ? Put ice in a plastic bag. ? Place a towel between your skin and the bag. ? Leave the ice on for 20 minutes, 2-3 times per day.  Use a heating pad or a moist heat pack as directed by your health care provider.  Exercise as directed by your health care provider or physical therapist.  Keep all follow-up visits as directed by your health care provider. This is important. Contact a health care provider if:  Your pain is not controlled with medicine.  You have a fever.  You have increasingly severe pain. Get help right away if:  You have weakness, numbness, or tingling in your legs or feet.  You lose control of your bladder or bowel. This information is not intended to replace advice given to you by your health care provider. Make sure you discuss any questions you have with your health care provider. Document Released: 10/17/2008 Document Revised: 12/27/2015 Document Reviewed: 03/28/2014 Elsevier Interactive Patient Education  [employer]2018 [employer] Inc[employer]

## 2017-07-01 NOTE — Progress Notes (Signed)
Chief Complaint  Patient presents with  . Leg Pain    right leg pain since he was in his 20's. When weather changes it comes back. Its a "nerve" type pain related to discs in his back.     3 days ago he developed acute onset of recurrent pain in the right low back, which shoots down the right leg.  He has increased pain, feels like it will give out when he puts a lot of pressure on the right leg.  Denies weakness, foot drag, bowel or bladder changes.  He was walking when the pain recurred, no injury (previously has gotten pain flare like this when steps wrong).  He is very active, up in tree stands, hunting. Last flare was about 3 months ago.  He wasn't seen/evaluated here--he toughed it out and it got better on its own.  Sometimes it is on the left, sometimes it is on the right. Sometimes his back will pop and the pain resolves. He is very frustrated about the frequent recurrences of back pain. He recalls getting PT in the past, and still does some of the home stretches he was shown.  He reports his original problem with back pain started when he stepped wrong in the woods with his son, fell down onto his knees--about 15-20 years ago.   PMH, PSH, SH reviewed  He reports having EGD planned in near future, and labs to be drawn prior. Review of chart shows no labs done in this office since 2015.  Outpatient Encounter Medications as of 07/01/2017  Medication Sig Note  . omeprazole (PRILOSEC) 40 MG capsule Take 1 capsule (40 mg total) 2 (two) times daily by mouth.   . [DISCONTINUED] Dexlansoprazole (DEXILANT) 30 MG capsule Take 1 capsule (30 mg total) by mouth daily.   . naproxen sodium (ALEVE) 220 MG tablet Take 440 mg by mouth. 07/01/2017: Taking 2 in the evening for the last 3 days   No facility-administered encounter medications on file as of 07/01/2017.    No Known Allergies  ROS: no fever, chills, urinary complaints, bowel changes, headaches, dizziness, numbness, tingling, foot  drop or other weakness (just fear of giving way due to pain).  He denies bleeding, bruising, rash.   No chest pain, shortness of breath, palpitations, nausea, vomiting. No other joint pains  PHYSICAL EXAM:  BP 110/70   Pulse 68   Ht 6\' 1"  (1.854 m)   Wt 174 lb (78.9 kg)   BMI 22.96 kg/m   Well appearing male, in no acute distress HEENT: EOMI, conjunctiva and sclera are clear Neck: no lymphadenopathy, spinal tenderness or mass Heart: regular rate and rhythm Lungs: clear Back: Spine nontender, no CVA tenderness. He is tender at right SI joint and into pyriformis.  He has pain with pyriformis stretch.  Negative straight leg raise. DTR's are 2+ and symmetric. Normal strength, sensation intact to light touch. Normal gait Skin on back normal, no rash; legs not visualized Psych: normal mood, affect, hygiene and grooming  ASSESSMENT/PLAN:   Pyriformis syndrome, right - NSAID, muscle relaxers, stretches shown; consider chiro vs PT. Intermittently also having paraspinous spasm (not currently) - Plan: metaxalone (SKELAXIN) 800 MG tablet  Need for influenza vaccination - Plan: Flu Vaccine QUAD 6+ mos PF IM (Fluarix Quad PF)  Gastroesophageal reflux disease with esophagitis - due for EGD soon; counseled re: risks of prolonged PPI therapy--MVI recommended. D/W his GI  Risks/side effects of PPI, muscle relaxants reviewed. Discussed importance of core strengthening, hamstring flexibility.  Shown pyriformis stretches. Heat, massage. Discussed PT vs chiro--given tenderness at SI joint, would try chiro first (he seems to have reduced pain when things "pop" on their own).  25-30 min visit, more than 1/2 spent counseling.    Increase Aleve to two tablets twice daily, taking with food. You can take this for up to 10-14 days. Continue your omeprazole.  Do not take ibuprofen, Goody/BC, motrin or any other pain medication. You MAY take acetaminophen (Tylenol) along with the aleve, if needed. Use the  skelaxin up to 3 times daily.  Some people it may make a little drowsy, so use caution if taking during the day and driving.   Continue to do the pyriformis stretches and shown.  Your have inflammation at your SI joint and pyriformis muscle, which is pinching the sciatic nerve. Sometimes the chiropractor can make this better faster than time or physical therapy alone.  PT is definitely an option if the anti-inflammatory, muscle relaxant and stretches alone are not helping.   I recommend having routine bloodwork due to evalute for nutritional deficiencies related to longterm PPI (omeprazole use), since you are not taking regular supplements/vitamins.  I usually check blood counts, Magnesium periodically.  I highly recommend that you work on core strengthening (back, abdominal muscles) and flexibility to help prevent future back problems.

## 2017-07-03 ENCOUNTER — Encounter: Payer: Self-pay | Admitting: Gastroenterology

## 2017-07-17 ENCOUNTER — Ambulatory Visit (AMBULATORY_SURGERY_CENTER): Payer: 59 | Admitting: Gastroenterology

## 2017-07-17 ENCOUNTER — Encounter: Payer: Self-pay | Admitting: Gastroenterology

## 2017-07-17 ENCOUNTER — Other Ambulatory Visit: Payer: Self-pay

## 2017-07-17 VITALS — BP 125/73 | HR 53 | Temp 97.8°F | Resp 20 | Ht 71.25 in | Wt 173.0 lb

## 2017-07-17 DIAGNOSIS — K2970 Gastritis, unspecified, without bleeding: Secondary | ICD-10-CM | POA: Diagnosis not present

## 2017-07-17 DIAGNOSIS — K2990 Gastroduodenitis, unspecified, without bleeding: Secondary | ICD-10-CM | POA: Diagnosis not present

## 2017-07-17 DIAGNOSIS — K219 Gastro-esophageal reflux disease without esophagitis: Secondary | ICD-10-CM

## 2017-07-17 MED ORDER — SODIUM CHLORIDE 0.9 % IV SOLN: 500.0000 mL | INTRAVENOUS | Status: DC

## 2017-07-17 NOTE — Progress Notes (Signed)
Pt. Reports no change in his medical or surgical history since his pre-visit.

## 2017-07-17 NOTE — Op Note (Signed)
[facility] Patient Name: Samuel Duncan Procedure Date: 07/17/2017 3:03 PM MRN: [id number] Endoscopist: Rachael Feeaniel P Samuel Duncan , MD Age: [age] Referring MD:  Date of Birth: September 02, 1970 Gender: Male Account #: [id number] Procedure:                Upper GI endoscopy Indications:              Suspected gastro-esophageal reflux disease, Early                            satiety, Globus sensation, intermittent hoarseness Medicines:                Monitored Anesthesia Care Procedure:                Pre-Anesthesia Assessment:                           - Prior to the procedure, a History and Physical                            was performed, and patient medications and                            allergies were reviewed. The patient's tolerance of                            previous anesthesia was also reviewed. The risks                            and benefits of the procedure and the sedation                            options and risks were discussed with the patient.                            All questions were answered, and informed consent                            was obtained. Prior Anticoagulants: The patient has                            taken no previous anticoagulant or antiplatelet                            agents. ASA Grade Assessment: II - A patient with                            mild systemic disease. After reviewing the risks                            and benefits, the patient was deemed in                            satisfactory condition to undergo the procedure.  After obtaining informed consent, the endoscope was                            passed under direct vision. Throughout the                            procedure, the patient's blood pressure, pulse, and                            oxygen saturations were monitored continuously. The                            Model GIF-[id number](SN#[id number]) scope was introduced                            through  the mouth, and advanced to the second part                            of duodenum. The upper GI endoscopy was                            accomplished without difficulty. The patient                            tolerated the procedure well. Scope In: Scope Out: Findings:                 The esophagus was normal.                           Mild inflammation characterized by erythema,                            friability and granularity was found in the gastric                            antrum. Biopsies were taken with a cold forceps for                            histology.                           The examined duodenum was normal. Complications:            No immediate complications. Estimated blood loss:                            None. Estimated Blood Loss:     Estimated blood loss: none. Impression:               - Mild gastritis, biopsied to check for H. pylori.                           - No acid related damage (esophagitis) to the  esophagus. Recommendation:           - Patient has a contact number available for                            emergencies. The signs and symptoms of potential                            delayed complications were discussed with the                            patient. Return to normal activities tomorrow.                            Written discharge instructions were provided to the                            patient.                           - Resume previous diet.                           - Continue present medications. Try to take the                            omeprazole (prilosec) 20-30 minutes before your                            breakfast and dinner meals as that is the way the                            medicine is designed to work most effectively.                           - Await pathology results. Rachael Fee, MD 07/17/2017 3:14:15 PM This report has been signed electronically.

## 2017-07-17 NOTE — Patient Instructions (Signed)
YOU HAD AN ENDOSCOPIC PROCEDURE TODAY AT THE [facility]:   Refer to the procedure report that was given to you for any specific questions about what was found during the examination.  If the procedure report does not answer your questions, please call your gastroenterologist to clarify.  If you requested that your care partner not be given the details of your procedure findings, then the procedure report has been included in a sealed envelope for you to review at your convenience later.  YOU SHOULD EXPECT: Some feelings of bloating in the abdomen. Passage of more gas than usual.  Walking can help get rid of the air that was put into your GI tract during the procedure and reduce the bloating. If you had a lower endoscopy (such as a colonoscopy or flexible sigmoidoscopy) you may notice spotting of blood in your stool or on the toilet paper. If you underwent a bowel prep for your procedure, you may not have a normal bowel movement for a few days.  Please Note:  You might notice some irritation and congestion in your nose or some drainage.  This is from the oxygen used during your procedure.  There is no need for concern and it should clear up in a day or so.  SYMPTOMS TO REPORT IMMEDIATELY:   Following upper endoscopy (EGD)  Vomiting of blood or coffee ground material  New chest pain or pain under the shoulder blades  Painful or persistently difficult swallowing  New shortness of breath  Fever of 100F or higher  Black, tarry-looking stools  For urgent or emergent issues, a gastroenterologist can be reached at any hour by calling [phone].   DIET:  We do recommend a small meal at first, but then you may proceed to your regular diet.  Drink plenty of fluids but you should avoid alcoholic beverages for 24 hours.  ACTIVITY:  You should plan to take it easy for the rest of today and you should NOT DRIVE or use heavy machinery until tomorrow (because of the sedation medicines used  during the test).    FOLLOW UP: Our staff will call the number listed on your records the next business day following your procedure to check on you and address any questions or concerns that you may have regarding the information given to you following your procedure. If we do not reach you, we will leave a message.  However, if you are feeling well and you are not experiencing any problems, there is no need to return our call.  We will assume that you have returned to your regular daily activities without incident.  If any biopsies were taken you will be contacted by phone or by letter within the next 1-3 weeks.  Please call us at [phone] if you have not heard about the biopsies in 3 weeks.   Await for biopsy results Try to take omeprazole (prilosec) 20-30 minutes before your breakfast and dinner meals as that is the way the medicine is designed to work most effectively.  SIGNATURES/CONFIDENTIALITY: You and/or your care partner have signed paperwork which will be entered into your electronic medical record.  These signatures attest to the fact that that the information above on your After Visit Summary has been reviewed and is understood.  Full responsibility of the confidentiality of this discharge information lies with you and/or your care-partner.

## 2017-07-17 NOTE — Progress Notes (Signed)
Report to PACU, RN, vss, BBS= Clear.  

## 2017-07-17 NOTE — Progress Notes (Signed)
Called to room to assist during endoscopic procedure.  Patient ID and intended procedure confirmed with present staff. Received instructions for my participation in the procedure from the performing physician.  

## 2017-07-20 ENCOUNTER — Telehealth: Payer: Self-pay

## 2017-07-20 NOTE — Telephone Encounter (Signed)
Left message for Fu call. Will attempt another call this afternoon.

## 2017-07-29 ENCOUNTER — Encounter: Payer: Self-pay | Admitting: Gastroenterology

## 2017-10-06 ENCOUNTER — Other Ambulatory Visit: Payer: Self-pay | Admitting: Physician Assistant

## 2017-12-18 ENCOUNTER — Ambulatory Visit: Payer: 59 | Admitting: Family Medicine

## 2017-12-18 ENCOUNTER — Encounter: Payer: Self-pay | Admitting: Family Medicine

## 2017-12-18 VITALS — BP 132/88 | HR 66 | Temp 97.8°F | Ht 72.5 in | Wt 174.4 lb

## 2017-12-18 DIAGNOSIS — Z638 Other specified problems related to primary support group: Secondary | ICD-10-CM

## 2017-12-18 DIAGNOSIS — Z23 Encounter for immunization: Secondary | ICD-10-CM | POA: Diagnosis not present

## 2017-12-18 DIAGNOSIS — R0789 Other chest pain: Secondary | ICD-10-CM | POA: Diagnosis not present

## 2017-12-18 NOTE — Patient Instructions (Signed)
Call the Alzheimer's disease association and get their help

## 2017-12-18 NOTE — Addendum Note (Signed)
Addended by: Renelda Loma on: 12/18/2017 04:33 PM   Modules accepted: Orders

## 2017-12-18 NOTE — Progress Notes (Signed)
   Subjective:    Patient ID: Samuel Duncan, male    DOB: 1971/07/07, 47 y.o.   MRN: [id number]  HPI He is here for evaluation of a several week history of mid chest pain.  He states that the pain usually occurs around 5:00 when he comes home from work.  When asked why he explained that his mother is now living with him and apparently she has Alzheimer's disease and at times is difficult to deal with. He also needs an update on his immunizations.  His son and daughter-in-law about to have a child.  Review of Systems     Objective:   Physical Exam Alert and in no distress.  Cardiac exam shows regular rhythm without murmurs or gallops.  Lungs clear to auscultation.  EKG shows sinus bradycardia otherwise normal       Assessment & Plan:  Need for Tdap vaccination - Plan: Tdap vaccine greater than or equal to 7yo IM  Stress due to family tension  I explained that I did understand the stress that he is going under.  Strongly encouraged him to get involved with the Alzheimer's organization help for him and other family members on how to best handle his mother.

## 2018-01-27 ENCOUNTER — Encounter: Payer: Self-pay | Admitting: Family Medicine

## 2018-01-27 ENCOUNTER — Ambulatory Visit: Payer: 59 | Admitting: Family Medicine

## 2018-01-27 VITALS — BP 102/72 | HR 73 | Temp 97.5°F

## 2018-01-27 DIAGNOSIS — M5441 Lumbago with sciatica, right side: Secondary | ICD-10-CM | POA: Diagnosis not present

## 2018-01-27 NOTE — Progress Notes (Signed)
   Subjective:    Patient ID: Samuel Duncan, male    DOB: 07/26/1971, 47 y.o.   MRN: [id number]  HPI He complains of a one-week history of low back pain.  He states that when he got out of bed he immediately had pain mainly on the right side as well as some numbness  He did note some slight weakness in the right leg but no numbness or tingling.  The pain can radiate down to his great toe.   He has a previous history of trouble with his back and states that in the past he had had x-rays as well as MRI.  Apparently the MRI showed DJD.   Review of Systems     Objective:   Physical Exam Alert and complaining of back pain.  Loss of lumbar lordosis was noted.  Difficult to fully flex his spine.  Slight tenderness over sciatic notch and to lesser extent over SI joint on the right.  Straight leg raising is positive at 45 degrees with positive sciatic stretch..  DTRs normal.  Motor function is normal.       Assessment & Plan:  Acute right-sided low back pain with right-sided sciatica - Plan: DG Lumbar Spine Complete This sounds like an L5 radiculopathy. I will treat initially with 2 Aleve twice per day.  Instructed him on proper sitting lifting and standing.  He will use heat 20 minutes 3 times per day.  Discussed long-term care of DJD with him.  Recheck here in 2 weeks however he gets worse, he will call.

## 2018-01-27 NOTE — Patient Instructions (Signed)
Take 2 Aleve twice per day you can also take Tylenol with that .  Recheck some is a cardiologist appointment with all she does have

## 2018-02-01 ENCOUNTER — Ambulatory Visit
Admission: RE | Admit: 2018-02-01 | Discharge: 2018-02-01 | Disposition: A | Discharge status: Home / Self Care | Payer: 59 | Source: Ambulatory Visit | Attending: Family Medicine | Admitting: Family Medicine

## 2018-02-01 DIAGNOSIS — M47816 Spondylosis without myelopathy or radiculopathy, lumbar region: Secondary | ICD-10-CM | POA: Diagnosis not present

## 2018-02-02 ENCOUNTER — Other Ambulatory Visit: Payer: Self-pay | Admitting: Gastroenterology

## 2018-02-09 ENCOUNTER — Ambulatory Visit: Payer: 59 | Admitting: Family Medicine

## 2018-02-09 ENCOUNTER — Encounter: Payer: Self-pay | Admitting: Family Medicine

## 2018-02-09 VITALS — BP 118/68 | HR 65 | Temp 97.7°F | Wt 173.8 lb

## 2018-02-09 DIAGNOSIS — M5441 Lumbago with sciatica, right side: Secondary | ICD-10-CM | POA: Diagnosis not present

## 2018-02-09 MED ORDER — TRAMADOL HCL 50 MG PO TABS: 50.0000 mg | ORAL_TABLET | Freq: Three times a day (TID) | ORAL | 0 refills | Status: DC | PRN

## 2018-02-09 NOTE — Progress Notes (Signed)
   Subjective:    Patient ID: Samuel Duncan, male    DOB: May 24, 1971, 47 y.o.   MRN: [id number]  HPI He is here for recheck.  He states that he is 15% better.  He does occasionally have twinges in his back but states that the weakness is not any worse.   Review of Systems     Objective:   Physical Exam Alert and in no distress.  Good motion of his back.  Straight leg raising is now positive at 75 degrees.  DTRs are normal.  Normal motor function.       Assessment & Plan:  Acute right-sided low back pain with right-sided sciatica - Plan: traMADol (ULTRAM) 50 MG tablet I will give him Ultram to add to his regimen but he will continue on the Aleve.  Instructed on proper sitting and lifting techniques.  Explained that this can sometimes take several months to go away completely.  Recheck here in 1 month.

## 2018-02-09 NOTE — Patient Instructions (Signed)
Continue to use Aleve and maintain proper posturing and if you notice any weakness in your leg let me know

## 2018-03-12 ENCOUNTER — Ambulatory Visit: Payer: Self-pay | Admitting: Family Medicine

## 2018-03-15 ENCOUNTER — Ambulatory Visit: Payer: 59 | Admitting: Family Medicine

## 2018-03-15 VITALS — BP 110/76 | HR 57 | Temp 97.5°F | Wt 172.2 lb

## 2018-03-15 DIAGNOSIS — M5441 Lumbago with sciatica, right side: Secondary | ICD-10-CM | POA: Diagnosis not present

## 2018-03-15 DIAGNOSIS — M545 Low back pain, unspecified: Secondary | ICD-10-CM

## 2018-03-15 MED ORDER — TRAMADOL HCL 50 MG PO TABS: 50.0000 mg | ORAL_TABLET | Freq: Three times a day (TID) | ORAL | 0 refills | Status: DC | PRN

## 2018-03-15 NOTE — Progress Notes (Signed)
   Subjective:    Patient ID: Samuel Duncan, male    DOB: 14-May-1971, 47 y.o.   MRN: [id number]  HPI He is here for recheck on his back pain.  The right sided back pain is still causing some throbbing sensation requiring continued use of tramadol.  He now states that he is having left-sided back pain in the buttock area but no numbness, tingling, bowel or bladder trouble.   Review of Systems     Objective:   Physical Exam Exam of the back does show tenderness to palpation on the left in the sacral notch area.  No tenderness over SI joint.  No pain tenderness over the lumbar spine.  Negative straight leg raising bilaterally.  DTRs normal.       Assessment & Plan:  Acute low back pain without sciatica, unspecified back pain laterality - Plan: MR Lumbar Spine Wo Contrast  Acute right-sided low back pain with right-sided sciatica - Plan: traMADol (ULTRAM) 50 MG tablet Since this is now causing difficulty on the left side, I think an MRI is warranted.  He really does not have any red flags.

## 2018-03-19 ENCOUNTER — Other Ambulatory Visit: Payer: Self-pay | Admitting: Family Medicine

## 2018-03-19 DIAGNOSIS — Z77018 Contact with and (suspected) exposure to other hazardous metals: Secondary | ICD-10-CM

## 2018-03-29 ENCOUNTER — Ambulatory Visit
Admission: RE | Admit: 2018-03-29 | Discharge: 2018-03-29 | Disposition: A | Discharge status: Home / Self Care | Payer: 59 | Source: Ambulatory Visit | Attending: Family Medicine | Admitting: Family Medicine

## 2018-03-29 DIAGNOSIS — Z135 Encounter for screening for eye and ear disorders: Secondary | ICD-10-CM | POA: Diagnosis not present

## 2018-03-29 DIAGNOSIS — M4807 Spinal stenosis, lumbosacral region: Secondary | ICD-10-CM | POA: Diagnosis not present

## 2018-03-29 DIAGNOSIS — Z77018 Contact with and (suspected) exposure to other hazardous metals: Secondary | ICD-10-CM

## 2018-04-08 ENCOUNTER — Ambulatory Visit: Payer: Self-pay | Admitting: Family Medicine

## 2018-04-19 ENCOUNTER — Encounter: Payer: Self-pay | Admitting: Family Medicine

## 2018-04-19 ENCOUNTER — Ambulatory Visit: Payer: 59 | Admitting: Family Medicine

## 2018-04-19 VITALS — BP 116/70 | HR 81 | Temp 97.9°F | Wt 172.8 lb

## 2018-04-19 DIAGNOSIS — M545 Low back pain, unspecified: Secondary | ICD-10-CM

## 2018-04-19 DIAGNOSIS — Z23 Encounter for immunization: Secondary | ICD-10-CM

## 2018-04-19 NOTE — Progress Notes (Signed)
   Subjective:    Patient ID: Samuel HallmarkDon Michael Duncan, male    DOB: 1970-08-13, 47 y.o.   MRN: [id number]  HPI He is here for follow-up on his back pain.  He is roughly 80% better but still finds it difficult to be as active as he would like.   Review of Systems     Objective:   Physical Exam Alert and in no distress.  Good motion of his back is noted.       Assessment & Plan:  Acute low back pain without sciatica, unspecified back pain laterality - Plan: Ambulatory referral to Orthopedic Surgery  Need for influenza vaccination - Plan: Flu Vaccine QUAD 6+ mos PF IM (Fluarix Quad PF) I reviewed the MRI with him.  Since he is still having difficulty I think possible referral to Dr. Alvester MorinNewton might be valuable.

## 2018-04-22 ENCOUNTER — Telehealth (INDEPENDENT_AMBULATORY_CARE_PROVIDER_SITE_OTHER): Payer: Self-pay | Admitting: Radiology

## 2018-04-22 NOTE — Telephone Encounter (Signed)
Patient left message returning call to schedule OV appointment with Dr. Alvester MorinNewton. Referral in Epic, Toni AmendCourtney called yesterday and left message.  Patient call back # [phone]

## 2018-04-28 DIAGNOSIS — T1501XA Foreign body in cornea, right eye, initial encounter: Secondary | ICD-10-CM | POA: Diagnosis not present

## 2018-04-30 DIAGNOSIS — T1501XA Foreign body in cornea, right eye, initial encounter: Secondary | ICD-10-CM | POA: Diagnosis not present

## 2018-05-06 ENCOUNTER — Encounter (INDEPENDENT_AMBULATORY_CARE_PROVIDER_SITE_OTHER): Payer: Self-pay | Admitting: Physical Medicine and Rehabilitation

## 2018-05-06 ENCOUNTER — Ambulatory Visit (INDEPENDENT_AMBULATORY_CARE_PROVIDER_SITE_OTHER): Payer: 59 | Admitting: Physical Medicine and Rehabilitation

## 2018-05-06 VITALS — BP 121/79 | HR 65 | Ht 74.0 in | Wt 173.0 lb

## 2018-05-06 DIAGNOSIS — M5441 Lumbago with sciatica, right side: Secondary | ICD-10-CM

## 2018-05-06 DIAGNOSIS — G8929 Other chronic pain: Secondary | ICD-10-CM

## 2018-05-06 DIAGNOSIS — M5116 Intervertebral disc disorders with radiculopathy, lumbar region: Secondary | ICD-10-CM

## 2018-05-06 MED ORDER — MELOXICAM 15 MG PO TABS: 15.0000 mg | ORAL_TABLET | Freq: Every day | ORAL | 0 refills | Status: DC

## 2018-05-06 NOTE — Progress Notes (Signed)
 .  Numeric Pain Rating Scale and Functional Assessment Average Pain 9 Pain Right Now 2 My pain is intermittent, sharp and stabbing Pain is worse with: walking, bending and some activites Pain improves with: nothing   In the last MONTH (on 0-10 scale) has pain interfered with the following?  1. General activity like being  able to carry out your everyday physical activities such as walking, climbing stairs, carrying groceries, or moving a chair?  Rating(8)  2. Relation with others like being able to carry out your usual social activities and roles such as  activities at home, at work and in your community. Rating(7)  3. Enjoyment of life such that you have  been bothered by emotional problems such as feeling anxious, depressed or irritable?  Rating(2)

## 2018-05-06 NOTE — Patient Instructions (Signed)
Dr. Stuart McGill  "The Big Three" that will safely increase your endurance and protect your back: modified curl-up, side bridge, and bird dog.  1. Modified Curl-Up Lie your back with one knee bent and one knee straight, this puts your pelvis in a neutral position and the muscles of your core in an optimal alignment of pull to avoid strain on the low back. Place your hands under the arch of your low back and ensure that this arch is maintained throughout the curl-up. Start by bracing your abdomen; this is different from flexing your abs, bear down through your belly. Now make sure you can take a breath in and a breath out while maintaining this brace. If you cannot, stop there and practice doing just that until you've got it mastered! Now, pretend that your spine in your neck and your upper back are cemented together and do not move independently. Pick a spot on the ceiling and focus your gaze there, lift your shoulder blades about 30 off the floor and slowly return to the start position. Take note of your neck, and ensure that your chin isn't poking forward when you do a curl up. If you're struggling with that, focus on making a double chin. Perform 3 sets of 10-12.  2. Side Bridge Lie on your side and prop yourself up on your elbow. Ensure that your elbow is directly under your shoulder to avoid any unnecessary strain through your shoulder joint. With your legs straight, place your top foot on the ground in front of your bottom foot. Place your top hand on your bottom shoulder. While maintaining the natural curve of your spine, that is to say, be sure that your upper body isn't twisted or leaning forward, brace your abdomen, squeeze through your gluteals (clench your bum), and lift your hips up off the ground. Trini't forget to breathe! Hold for 8-10 seconds, repeat 3 times. As the exercise becomes easier, increase the number of repetitions as opposed to the length of time. There are a number of ways to  modify this exercise in order to increase or decrease the difficulty such as the example below on the right. If it's not challenging enough, try putting that top hand on your top hip, or straight up in the air, but again, be sure your body stays straight!  3. Bird Dog Start on your hands and knees with your hands shoulder width apart directly under your shoulders, and knees hip width apart directly under your hips. Maintain a neutral spine. Brace through your abdomen and squeeze your gluteals. Ensure you can maintain this while you take a breath in and out. Lift your right arm in front until it's level with your shoulder, squeezing the muscles between your shoulder blades as you do so. At the same time, extend your left leg straight back until it is level with your hips, squeezing your gluteals, and keeping your hips square to the floor. Return to the starting position in a slow and controlled manner, and perform the same action with the left arm and right leg. That is one repetition. Perform 3 sets of 8-10 repetitions. As this exercise becomes easy, focus on co-contracting the muscles of your forearm and arms while you extend, the same goes for the muscles of your legs. For an additional challenge, instead of putting your hand and knee back down on the ground between reps, try just sweeping the floor and performing the next rep right away, or draw a square with your   arm and leg and then sweep the floor.  *Avoid exercises that forward flex the spine or extend the spine. 

## 2018-05-13 NOTE — Progress Notes (Signed)
Samuel Duncan - 47 y.o. male MRN [id number]  Date of birth: [birth year]  Office Visit Note: Visit Date: 05/06/2018 PCP: Ronnald Nian, MD Referred by: Ronnald Nian, MD  Subjective: Chief Complaint  Patient presents with  . Right Leg - Pain  . Left Leg - Pain  . Lower Back - Pain   HPI: Samuel Duncan is a 47 y.o. male who comes in today at the request of Dr. Sharlot Gowda for evaluation and management of the patient's chronic worsening low back pain and right more than left hip and leg pain at times.  Patient reports back pain for over 20 years with history of being physically pretty active with working and hunting and fishing and extra activities outdoors.  At one point he had a significant radicular type pain with MRI findings of disc herniation.  He has had no prior lumbar surgery.  Most recently in June he woke up with pretty severe pain with some tingling in the right leg.  This is been treated by Dr. Susann Givens conservatively with anti-inflammatory as well as small amount of tramadol and activity modification.  The patient's pain can be as high as 9 out of 10 but he tries just to tough it out.  He does not really like taking medication that much.  He was taking the anti-inflammatory just intermittently.  He was taking a muscle relaxer but it did not really allow him to use it at work which he needs to.  In his line of work he does travel some so he does find that it is worse with sitting for prolonged.  But also bending and squatting make the symptoms significantly worse.  He can stand for a good length of time with mild pain and he can walk without difficulty.  He does a lot of climbing in his job as well and he feels like when he climbs he hears a popping sound or feels a popping sensation.  He reports nothing really overall has benefited him too much.  Dr. Susann Givens did complete new MRI which is reviewed with the patient today using the images and spine model.  It basically shows a  degenerative disc with protrusion at L5-S1 slightly more right than left without focal nerve compression.  Patient really has a good overall spine.  Review of Systems  Constitutional: Negative for chills, fever, malaise/fatigue and weight loss.  HENT: Negative for hearing loss and sinus pain.   Eyes: Negative for blurred vision, double vision and photophobia.  Respiratory: Negative for cough and shortness of breath.   Cardiovascular: Negative for chest pain, palpitations and leg swelling.  Gastrointestinal: Negative for abdominal pain, nausea and vomiting.  Genitourinary: Negative for flank pain.  Musculoskeletal: Positive for back pain. Negative for myalgias.       Bilateral hip and leg referral pain  Skin: Negative for itching and rash.  Neurological: Negative for tremors, focal weakness and weakness.  Endo/Heme/Allergies: Negative.   Psychiatric/Behavioral: Negative for depression.  All other systems reviewed and are negative.  Otherwise per HPI.  Assessment & Plan: Visit Diagnoses:  1. Chronic bilateral low back pain with right-sided sciatica   2. Radiculopathy due to lumbar intervertebral disc disorder     Plan: Findings:  Chronic many year history of combination of mechanical back pain as well as likely pain related to L5-S1 disc.  In all likelihood at one time he may have had a more significant disc herniation now there is degenerative disc height loss  and some right-sided bulging and protrusion but no focal nerve compression.  Patient has mechanical complaints as well as some complaints that are mainly due to the disc itself.  He may indeed have some discogenic pain.  Not much of the way of facet mediated pain but this cannot be totally ruled out.  We discussed the use of injections at times when the symptoms are bad enough and this would be looking at epidural type injection for discogenic pain or even something like a gray rami communicans block and radiofrequency ablation.  Lastly  consider facet joint diagnostic block.  Patient feels like his symptoms are probably not bad enough at this point for injections but he will consider it.  We discussed the use of anti-inflammatory medication to be used on a more consistent basis for short periods of time.  I have given him a prescription for meloxicam to use daily for the next 3 weeks.  We talked about activity modification and core strengthening and I did give him the name of some exercises to look up on the Internet.  He will follow-up with [place] as needed if the symptoms change and we can look at injection.  I would probably start out with epidural injection first.  Lastly patient did not really feel like physical therapy would be something he would do given the fact that he is very physically fit and does a lot of outdoor activities at this point and really did not feel like he would benefit from that.  We did have a discussion on getting someone who can at least give him a evaluation from a physical therapy standpoint.    Meds & Orders:  Meds ordered this encounter  Medications  . meloxicam (MOBIC) 15 MG tablet    Sig: Take 1 tablet (15 mg total) by mouth daily. Take with food    Dispense:  30 tablet    Refill:  0   No orders of the defined types were placed in this encounter.   Follow-up: Return if symptoms worsen or fail to improve.   Procedures: No procedures performed  No notes on file   Clinical History: MRI LUMBAR SPINE WITHOUT CONTRAST  TECHNIQUE: Multiplanar, multisequence MR imaging of the lumbar spine was performed. No intravenous contrast was administered.  COMPARISON:  Radiography 02/01/2018  FINDINGS: Segmentation:  5 lumbar type vertebral bodies.  Alignment:  Mild dextrocurvature  Vertebrae:  No fracture, evidence of discitis, or bone lesion.  Conus medullaris and cauda equina: Conus extends to the L1-2 level. Conus and cauda equina appear normal.  Paraspinal and other soft tissues:  Negative  Disc levels:  T12- L1: Unremarkable.  L1-L2: Unremarkable.  L2-L3: Unremarkable.  L3-L4: Unremarkable.  L4-L5: Unremarkable.  L5-S1:Disc narrowing worse towards the right. There is relatively mild disc bulging and far-lateral spurring. Negative facets. No impingement  IMPRESSION: 1. 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 2. Mild dextroscoliosis.   Electronically Signed   By: Marnee Spring M.D.   On: 03/29/2018 14:39   He reports that he has never smoked. He has never used smokeless tobacco. No results for input(s): HGBA1C, LABURIC in the last 8760 hours.  Objective:  VS:  HT:6\' 2"  (188 cm)   WT:173 lb (78.5 kg)  BMI:22.2    BP:121/79  HR:65bpm  TEMP: ( )  RESP:  Physical Exam  Musculoskeletal:  Patient has mild slowing with going from sit to stand.  Mild pain with full extension and facet loading.  He does have paraspinal tenderness  and pain in the quadratus lumborum right more than the left with taut band.  He has no pain over the PSIS or greater trochanter.  No pain with hip rotation and negative Patrick's testing.  He has good strength in the lower extremities bilaterally without any deficit side to side.  Equivocally positive slump test on the right.  No clonus.    Ortho Exam Imaging: No results found.  Past Medical/Family/Surgical/Social History: Medications & Allergies reviewed per EMR, new medications updated. There are no active problems to display for this patient.  Past Medical History:  Diagnosis Date  . GERD (gastroesophageal reflux disease) 03/17/2016   [facility] ENT Notes    Family History  Problem Relation Age of Onset  . Heart disease Mother   . Dementia Mother   . Hypertension Father   . Migraines Sister   . Breast cancer Maternal Grandmother   . Heart disease Maternal Grandfather   . Other Paternal Grandmother        brain tumor  . Alcoholism Paternal Grandfather   . Diabetes Maternal Aunt   .  Hypertension Maternal Aunt   . Hypertension Paternal Aunt   . Breast cancer Maternal Aunt   . Throat cancer Paternal Uncle   . Dementia Paternal Aunt        x 2  . Alzheimer's disease Paternal Uncle    Past Surgical History:  Procedure Laterality Date  . GANGLION CYST EXCISION Left    wrist  . INGUINAL HERNIA REPAIR Right   . TENDON REPAIR Right    middle finger   Social History   Occupational History  . Occupation: Engineer, drilling  Tobacco Use  . Smoking status: Never Smoker  . Smokeless tobacco: Never Used  Substance and Sexual Activity  . Alcohol use: No  . Drug use: No  . Sexual activity: Never

## 2018-05-25 ENCOUNTER — Other Ambulatory Visit: Payer: Self-pay | Admitting: Gastroenterology

## 2018-05-28 ENCOUNTER — Other Ambulatory Visit (INDEPENDENT_AMBULATORY_CARE_PROVIDER_SITE_OTHER): Payer: Self-pay | Admitting: Physical Medicine and Rehabilitation

## 2018-05-28 NOTE — Telephone Encounter (Signed)
Please Advise

## 2018-06-27 ENCOUNTER — Other Ambulatory Visit (INDEPENDENT_AMBULATORY_CARE_PROVIDER_SITE_OTHER): Payer: Self-pay | Admitting: Physical Medicine and Rehabilitation

## 2018-06-28 NOTE — Telephone Encounter (Signed)
Will refill x1 but patient will need to have Dr. Susann GivensLaLonde look at long term use

## 2018-06-28 NOTE — Telephone Encounter (Signed)
Left message to notify patient.

## 2018-06-28 NOTE — Telephone Encounter (Signed)
Please advise 

## 2019-09-07 IMAGING — CR DG ORBITS FOR FOREIGN BODY
2 series · 2 of 2 positions shown · non-contrast
Comparison: None.

CLINICAL DATA: Metal working/exposure; clearance prior to MRI

EXAM:
ORBITS FOR FOREIGN BODY - 2 VIEW

[w orbit pa (1 of 2)]
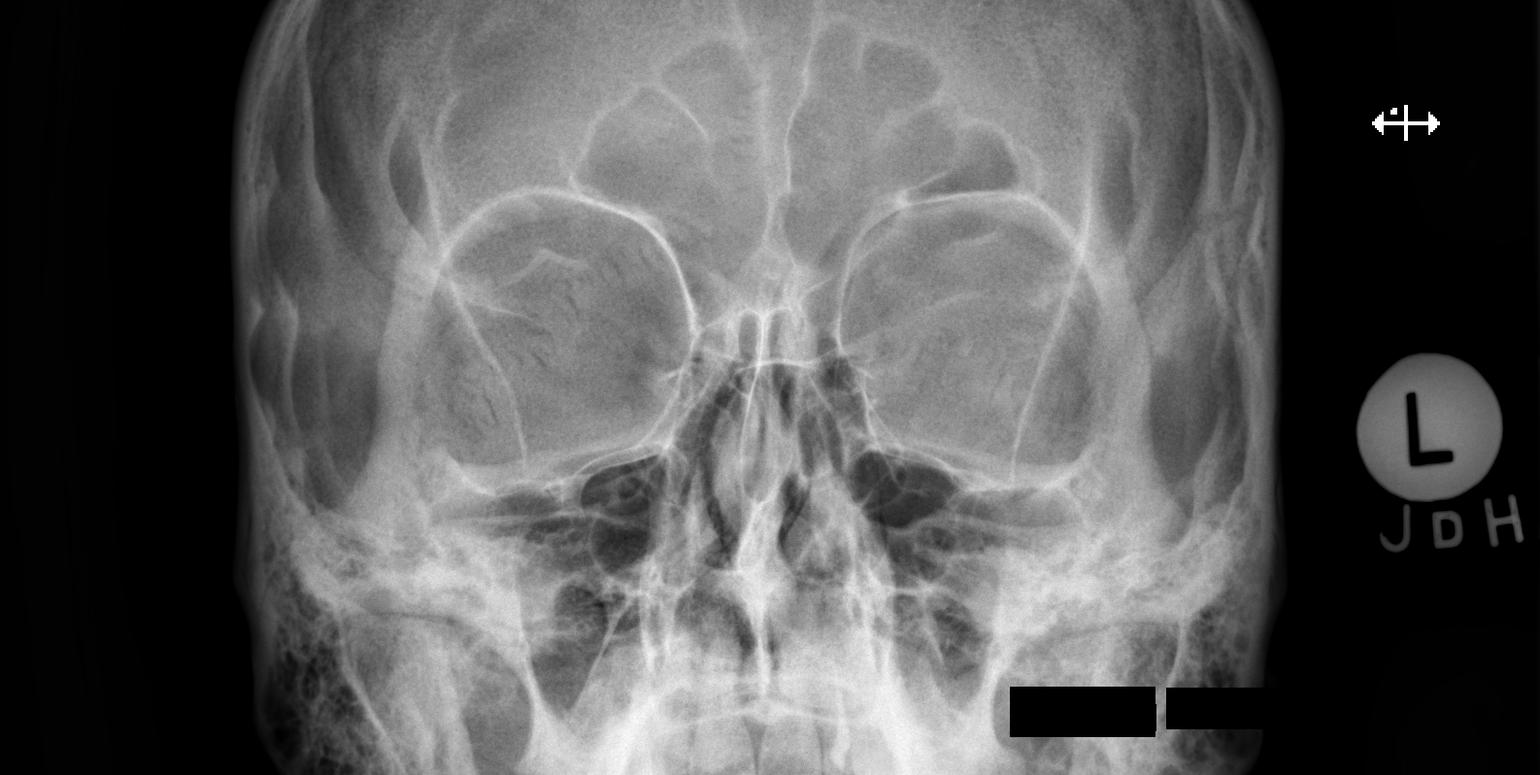

[w orbit pa (2 of 2)]
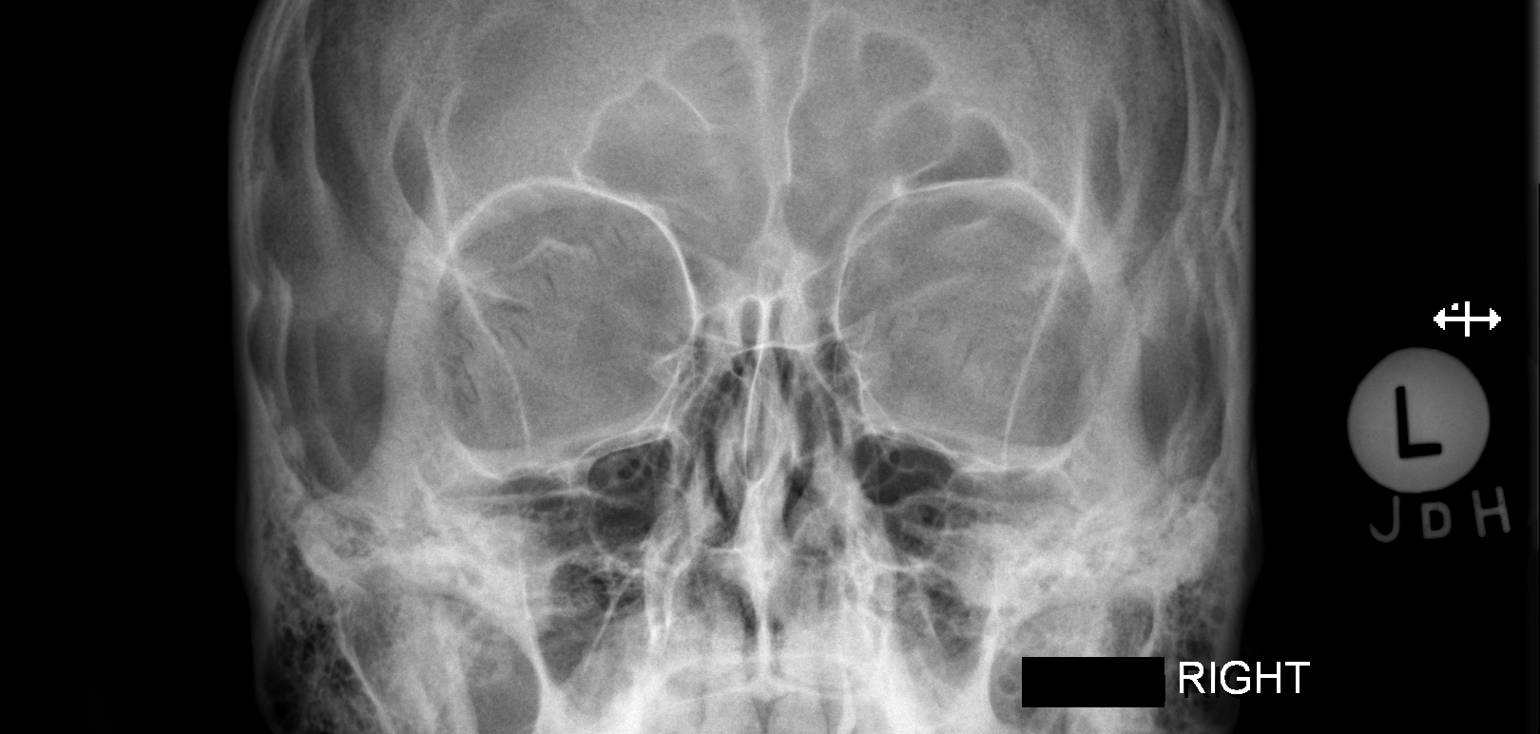

[2 of 2 positions shown; findings below may reference images not displayed]

FINDINGS: There is no evidence of metallic foreign body within the orbits. No
significant bone abnormality identified.
IMPRESSION: No evidence of metallic foreign body within the orbits.

## 2019-09-07 IMAGING — MR MR LUMBAR SPINE W/O CM
5 series · 48 of 48 positions shown · non-contrast
Comparison: Radiography 02/01/2018

CLINICAL DATA: Acute low back pain

EXAM:
MRI LUMBAR SPINE WITHOUT CONTRAST
TECHNIQUE: Multiplanar, multisequence MR imaging of the lumbar spine was
performed. No intravenous contrast was administered.

[Series 3: T2 · sagittal · 4.0mm · 0.88mm/px · 4 of 12 slices shown (1 of 2)]
[im 1/12]
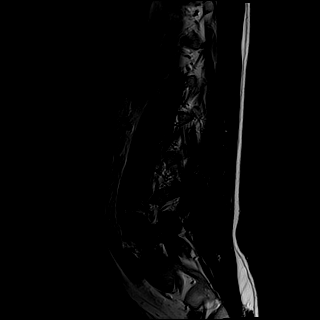
[im 4/12]
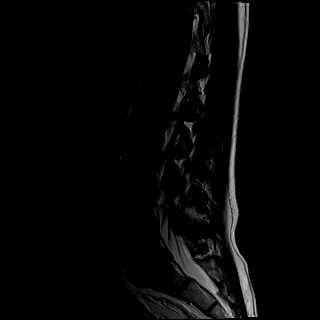
[im 8/12]
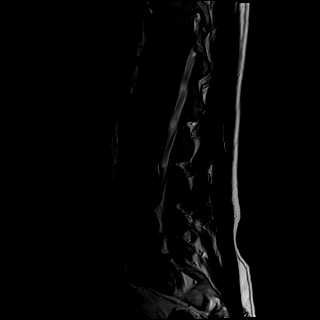
[im 12/12]
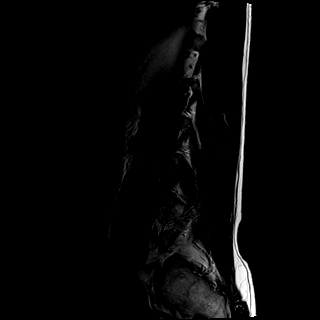

[Series 4: tirm sag · sagittal · 4.0mm · 0.88mm/px · 5 of 12 slices shown]
[im 1/12]
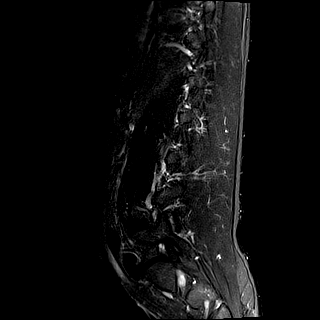
[im 3/12]
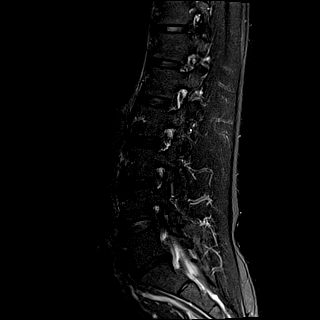
[im 6/12]
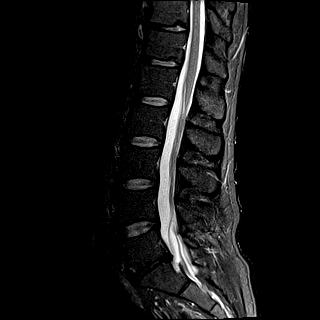
[im 9/12]
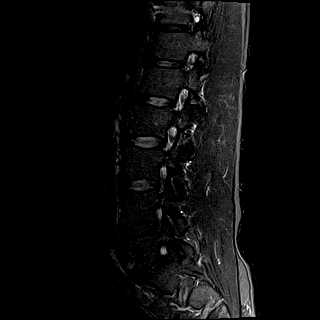
[im 12/12]
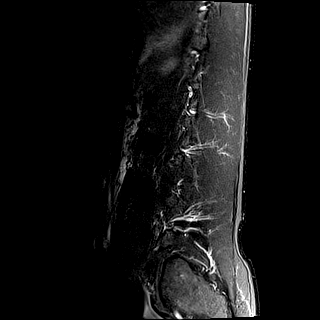

[Series 5: T1 · sagittal · 4.0mm · 0.88mm/px · 5 of 12 slices shown (1 of 2)]
[im 1/12]
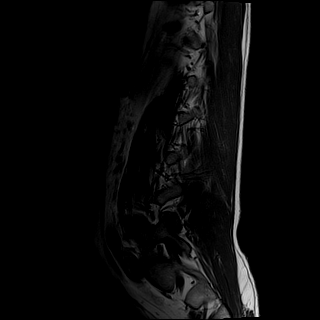
[im 3/12]
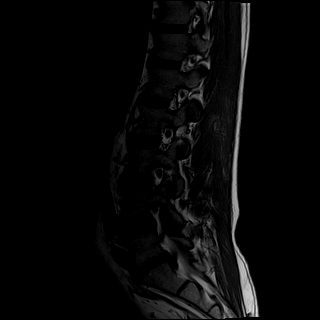
[im 6/12]
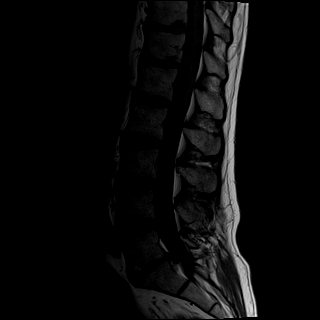
[im 9/12]
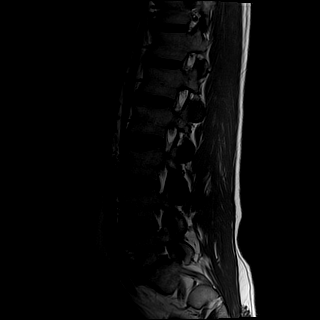
[im 12/12]
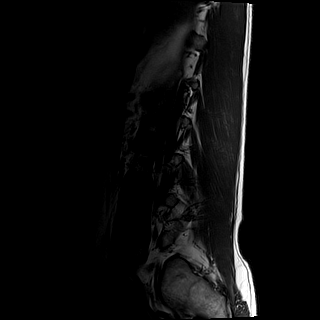

[Series 6: T2 · axial · 4.0mm · 0.70mm/px · z∈[-85,+136]mm · 17 of 41 slices shown (2 of 2)]
[im 1/41]
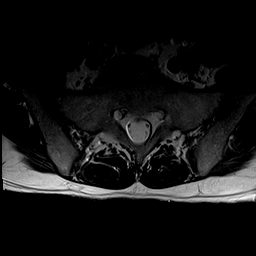
[im 3/41]
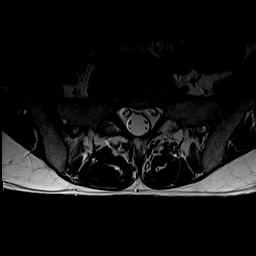
[im 6/41]
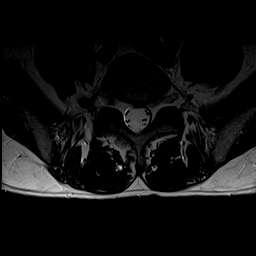
[im 8/41]
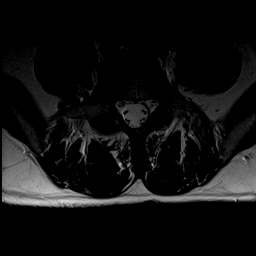
[im 11/41]
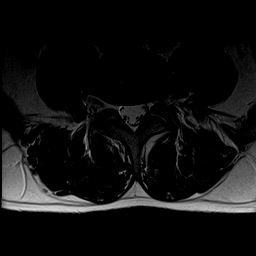
[im 13/41]
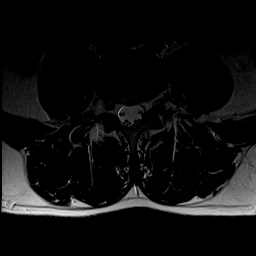
[im 16/41]
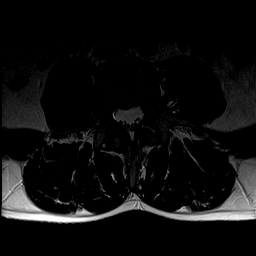
[im 18/41]
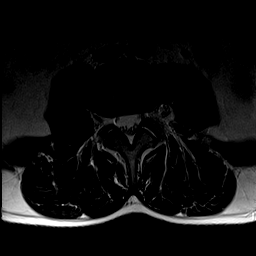
[im 21/41]
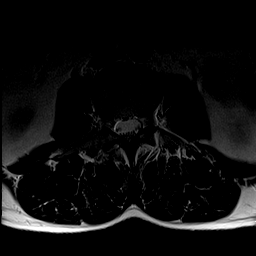
[im 23/41]
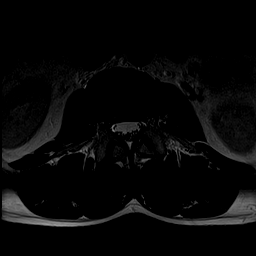
[im 26/41]
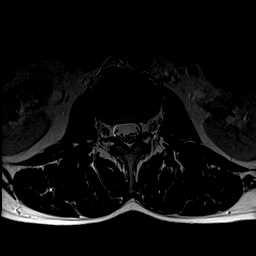
[im 28/41]
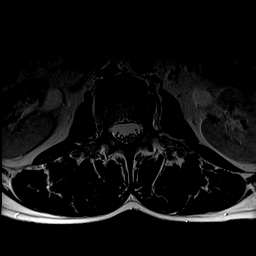
[im 31/41]
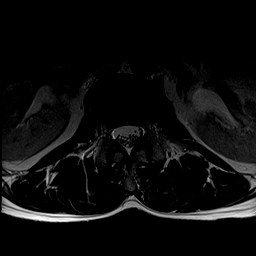
[im 33/41]
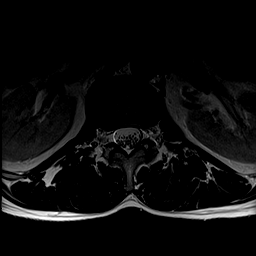
[im 36/41]
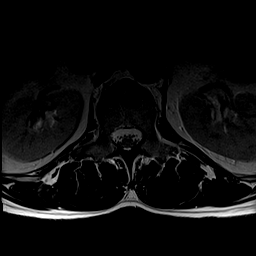
[im 38/41]
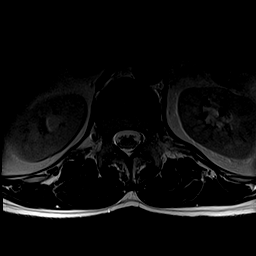
[im 41/41]
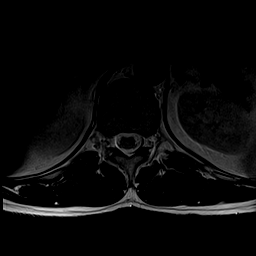

[Series 7: T1 · axial · 4.0mm · 0.70mm/px · z∈[-85,+136]mm · 17 of 41 slices shown (2 of 2)]
[im 1/41]
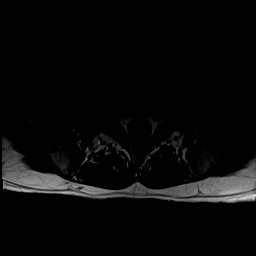
[im 3/41]
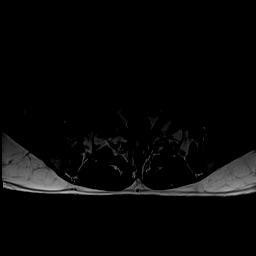
[im 6/41]
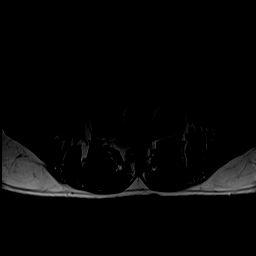
[im 8/41]
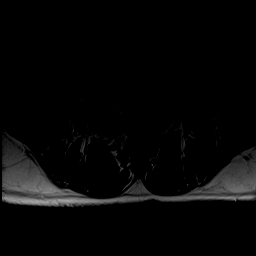
[im 11/41]
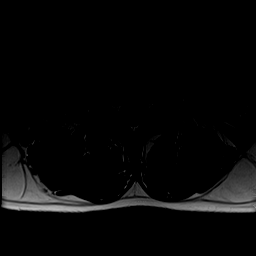
[im 13/41]
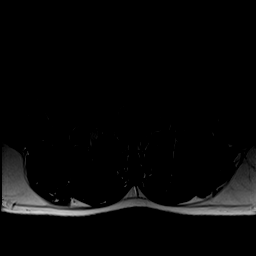
[im 16/41]
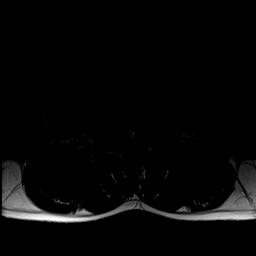
[im 18/41]
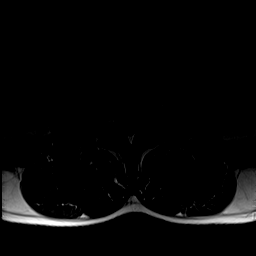
[im 21/41]
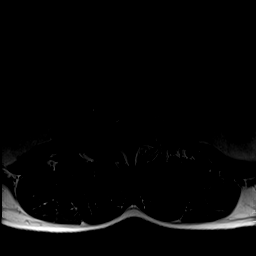
[im 23/41]
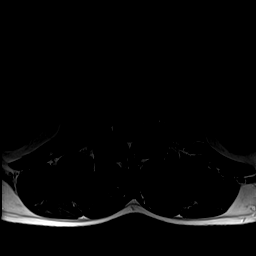
[im 26/41]
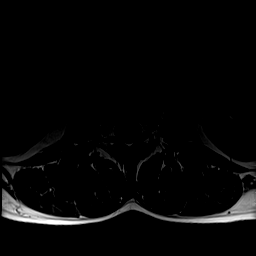
[im 28/41]
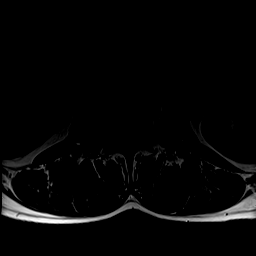
[im 31/41]
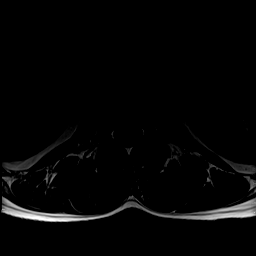
[im 33/41]
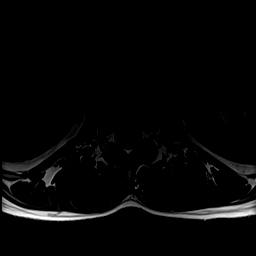
[im 36/41]
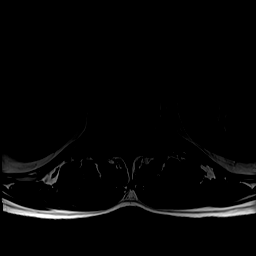
[im 38/41]
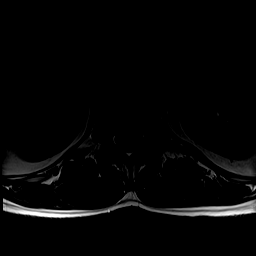
[im 41/41]
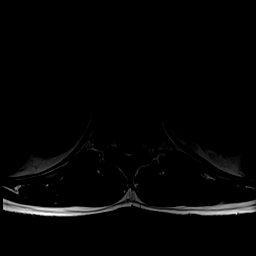

[48 of 48 positions shown; findings below may reference images not displayed]

FINDINGS: Segmentation:  5 lumbar type vertebral bodies.

Alignment:  Mild dextrocurvature

Vertebrae:  No fracture, evidence of discitis, or bone lesion.

Conus medullaris and cauda equina: Conus extends to the L1-2 level.
Conus and cauda equina appear normal.

Paraspinal and other soft tissues: Negative

Disc levels:

T12- L1: Unremarkable.

L1-L2: Unremarkable.

L2-L3: Unremarkable.

L3-L4: Unremarkable.

L4-L5: Unremarkable.

L5-S1:Disc narrowing worse towards the right. There is relatively
mild disc bulging and far-lateral spurring. Negative facets. No
impingement
IMPRESSION: 1. L5-S1 focal disc degeneration without impingement.
2. Mild dextroscoliosis.

## 2020-03-27 ENCOUNTER — Ambulatory Visit: Payer: 59 | Admitting: Orthopaedic Surgery

## 2020-03-28 ENCOUNTER — Ambulatory Visit: Payer: Self-pay

## 2020-03-28 ENCOUNTER — Ambulatory Visit (INDEPENDENT_AMBULATORY_CARE_PROVIDER_SITE_OTHER): Payer: Worker's Compensation | Admitting: Orthopaedic Surgery

## 2020-03-28 ENCOUNTER — Encounter: Payer: Self-pay | Admitting: Orthopaedic Surgery

## 2020-03-28 VITALS — BP 120/78 | HR 72 | Ht 74.0 in | Wt 174.0 lb

## 2020-03-28 DIAGNOSIS — M545 Low back pain, unspecified: Secondary | ICD-10-CM

## 2020-03-28 DIAGNOSIS — G8929 Other chronic pain: Secondary | ICD-10-CM

## 2020-03-28 NOTE — Progress Notes (Signed)
Office Visit Note   Patient: Samuel Duncan           Date of Birth: April 04, 1971           MRN: [id number] Visit Date: 03/28/2020              Requested by: Ronnald Nian, MD [address] [place],  [place] [address] PCP: Ronnald Nian, MD   Assessment & Plan: Visit Diagnoses:  1. Chronic bilateral low back pain, unspecified whether sciatica present     Plan: MRI scan lumbar for evaluation L5-S1 disc degeneration left greater than right.  Work slip given continued modified work no lifting greater than 15 pounds pending MRI review visit.  Follow-Up Instructions: Office follow-up after lumbar MRI  Orders:  Orders Placed This Encounter  Procedures  . XR Lumbar Spine 2-3 Views   No orders of the defined types were placed in this encounter.     Procedures: No procedures performed   Clinical Data: No additional findings.   Subjective: Chief Complaint  Patient presents with  . Lower Back - Pain    OTJI 01/19/2020    HPI 49 year old male with history of on-the-job injury on 01/19/2020 the lumbar spine.  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  Pain went down his left leg below his hip and radiated also into his right leg to his calf.  He has been through physical therapy which he feels like he has made his symptoms worse.  An MRI scan was supposed to be obtained but is not hurting thing from [facility].  He has been through therapy has been on a walking program.  He states he had one episode where his back locked up when he fell.  He tried to resume work activities which made it worse.  Patient's been on anti-inflammatories both Aleve and then later Mobic also tramadol.  He is taking prednisone Dosepak.  He is treated at [facility] urgent care with conservative treatment as outlined above.  He was  given modified work no lifting more than 20 pounds. Previous MRI scan 03/29/2018 showed some focal disc degeneration at L5-S1 with far lateral spurring ordered by PCP Dr. Susann Givens.   I have  see patient in the past for knee problem which resolved.   Review of Systems all other systems are negative as pertains HPI.   Objective: Vital Signs: BP 120/78   Pulse 72   Ht 6\' 2"  (1.88 m)   Wt 174 lb (78.9 kg)   BMI 22.34 kg/m   Physical Exam Constitutional:      Appearance: He is well-developed.  HENT:     Head: Normocephalic and atraumatic.  Eyes:     Pupils: Pupils are equal, round, and reactive to light.  Neck:     Thyroid: No thyromegaly.     Trachea: No tracheal deviation.  Cardiovascular:     Rate and Rhythm: Normal rate.  Pulmonary:     Effort: Pulmonary effort is normal.     Breath sounds: No wheezing.  Abdominal:     General: Bowel sounds are normal.     Palpations: Abdomen is soft.  Skin:    General: Skin is warm and dry.     Capillary Refill: Capillary refill takes less than 2 seconds.  Neurological:     Mental Status: He is  alert and oriented to person, place, and time.  Psychiatric:        Behavior: Behavior normal.        Thought Content: Thought content normal.        Judgment: Judgment normal.     Ortho Exam patient is intact anterior tib gastrocsoleus positive straight leg raising on the right with positive sciatic notch tenderness negative logroll of the hip Specialty Comments:  No specialty comments available.  Imaging: CLINICAL DATA:  Acute low back pain  EXAM: MRI LUMBAR SPINE WITHOUT CONTRAST  TECHNIQUE: Multiplanar, multisequence MR imaging of the lumbar spine was performed. No intravenous contrast was administered.  COMPARISON:  Radiography 02/01/2018  FINDINGS: Segmentation:  5 lumbar type vertebral bodies.  Alignment:  Mild dextrocurvature  Vertebrae:  No fracture, evidence of discitis, or bone lesion.  Conus medullaris and  cauda equina: Conus extends to the L1-2 level. Conus and cauda equina appear normal.  Paraspinal and other soft tissues: Negative  Disc levels:  T12- L1: Unremarkable.  L1-L2: Unremarkable.  L2-L3: Unremarkable.  L3-L4: Unremarkable.  L4-L5: Unremarkable.  L5-S1:Disc narrowing worse towards the right. There is relatively mild disc bulging and far-lateral spurring. Negative facets. No impingement  IMPRESSION: 1. 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 2. Mild dextroscoliosis.   Electronically Signed   By: Marnee Spring M.D.   On: 03/29/2018 14:39   PMFS History: There are no problems to display for this patient.  Past Medical History:  Diagnosis Date  . GERD (gastroesophageal reflux disease) 03/17/2016   [facility] ENT Notes     Family History  Problem Relation Age of Onset  . Heart disease Mother   . Dementia Mother   . Hypertension Father   . Migraines Sister   . Breast cancer Maternal Grandmother   . Heart disease Maternal Grandfather   . Other Paternal Grandmother        brain tumor  . Alcoholism Paternal Grandfather   . Diabetes Maternal Aunt   . Hypertension Maternal Aunt   . Hypertension Paternal Aunt   . Breast cancer Maternal Aunt   . Throat cancer Paternal Uncle   . Dementia Paternal Aunt        x 2  . Alzheimer's disease Paternal Uncle     Past Surgical History:  Procedure Laterality Date  . GANGLION CYST EXCISION Left    wrist  . INGUINAL HERNIA REPAIR Right   . TENDON REPAIR Right    middle finger   Social History   Occupational History  . Occupation: Engineer, drilling  Tobacco Use  . Smoking status: Never Smoker  . Smokeless tobacco: Never Used  Vaping Use  . Vaping Use: Never used  Substance and Sexual Activity  . Alcohol use: No  . Drug use: No  . Sexual activity: Never

## 2020-05-01 ENCOUNTER — Encounter: Payer: Self-pay | Admitting: Orthopaedic Surgery

## 2020-05-01 ENCOUNTER — Ambulatory Visit (INDEPENDENT_AMBULATORY_CARE_PROVIDER_SITE_OTHER): Payer: Worker's Compensation | Admitting: Orthopaedic Surgery

## 2020-05-01 DIAGNOSIS — M5126 Other intervertebral disc displacement, lumbar region: Secondary | ICD-10-CM | POA: Diagnosis not present

## 2020-05-01 NOTE — Addendum Note (Signed)
Addended by: Barbette Or on: 05/01/2020 09:37 AM   Modules accepted: Orders

## 2020-05-01 NOTE — Progress Notes (Signed)
Office Visit Note   Patient: Samuel Duncan           Date of Birth: [birth year]           MRN: [id number] Visit Date: 05/01/2020              Requested by: Ronnald Nian, MD [address] [place],  [place] [address] PCP: Ronnald Nian, MD   Assessment & Plan: Visit Diagnoses:  1. Protrusion of lumbar intervertebral disc     Plan: We will order 1 epidural L5-S1 right for disc protrusion.  I discussed with this point I do not recommend operative intervention.  He needs to look for his work activity where he does not have as much bending turning twisting.  Work slip given no work x3 weeks.  After that he can resume work activity.  Follow-Up Instructions: Return in about 6 weeks (around 06/12/2020).   Orders:  No orders of the defined types were placed in this encounter.  No orders of the defined types were placed in this encounter.     Procedures: No procedures performed   Clinical Data: No additional findings.   Subjective: Chief Complaint  Patient presents with  . Lower Back - Pain    HPI 49 year old male returns post MRI scan.  This shows disc desiccation disc base narrowing at L5-S1 with facet hypertrophy and right disc bulge causing lateral recess stenosis and mild right neuroforaminal narrowing on the right side.  Patient continues to have sharp pain whenever he coughs or sneezes.  At times when he is walking he has pain in his leg.  We reviewed the MRI scan images as well as report today.  Review of Systems 14 point systems updated unchanged from previous note.   Objective: Vital Signs: BP 116/79   Pulse 71   Physical Exam Constitutional:      Appearance: He is well-developed.  HENT:     Head: Normocephalic and atraumatic.  Eyes:     Pupils: Pupils are equal, round, and reactive to light.  Neck:     Thyroid: No thyromegaly.     Trachea: No tracheal deviation.  Cardiovascular:     Rate and Rhythm: Normal rate.  Pulmonary:     Effort:  Pulmonary effort is normal.     Breath sounds: No wheezing.  Abdominal:     General: Bowel sounds are normal.     Palpations: Abdomen is soft.  Skin:    General: Skin is warm and dry.     Capillary Refill: Capillary refill takes less than 2 seconds.  Neurological:     Mental Status: He is alert and oriented to person, place, and time.  Psychiatric:        Behavior: Behavior normal.        Thought Content: Thought content normal.        Judgment: Judgment normal.     Ortho Exam patient gets from sitting standing is able to heel toe walk he has increased discomfort with bending.  No isolated motor weakness.  Specialty Comments:  No specialty comments available.  Imaging: MRI scan done at [facility] shows L5-S1 disc desiccation facet arthropathy and right asymmetric disc bulging causing lateral recess stenosis and mild right neuroforaminal narrowing no significant central canal narrowing.  All other levels in the lumbar spine above that are normal.  Read by Abner Greenspan DO   PMFS History: Patient Active Problem List   Diagnosis Date Noted  . Protrusion of lumbar  intervertebral disc 05/01/2020   Past Medical History:  Diagnosis Date  . GERD (gastroesophageal reflux disease) 03/17/2016   [facility] ENT Notes     Family History  Problem Relation Age of Onset  . Heart disease Mother   . Dementia Mother   . Hypertension Father   . Migraines Sister   . Breast cancer Maternal Grandmother   . Heart disease Maternal Grandfather   . Other Paternal Grandmother        brain tumor  . Alcoholism Paternal Grandfather   . Diabetes Maternal Aunt   . Hypertension Maternal Aunt   . Hypertension Paternal Aunt   . Breast cancer Maternal Aunt   . Throat cancer Paternal Uncle   . Dementia Paternal Aunt        x 2  . Alzheimer's disease Paternal Uncle     Past Surgical History:  Procedure Laterality Date  . GANGLION CYST EXCISION Left    wrist  . INGUINAL HERNIA REPAIR Right   .  TENDON REPAIR Right    middle finger   Social History   Occupational History  . Occupation: Engineer, drilling  Tobacco Use  . Smoking status: Never Smoker  . Smokeless tobacco: Never Used  Vaping Use  . Vaping Use: Never used  Substance and Sexual Activity  . Alcohol use: No  . Drug use: No  . Sexual activity: Never

## 2020-05-07 ENCOUNTER — Telehealth: Payer: Self-pay

## 2020-05-07 NOTE — Telephone Encounter (Signed)
Patient called back returning missed call to sch injection with [facility]

## 2020-05-07 NOTE — Telephone Encounter (Signed)
Please advise 

## 2020-05-08 NOTE — Telephone Encounter (Signed)
Called and sch 10/21.

## 2020-05-14 ENCOUNTER — Telehealth: Payer: Self-pay | Admitting: Orthopaedic Surgery

## 2020-05-14 NOTE — Telephone Encounter (Signed)
Please advise. OK for note?  

## 2020-05-14 NOTE — Telephone Encounter (Signed)
Patient is having difficulty with work. He would like a return to work note with restrictions of no lifting greater than 15 lbs, no bending, no squatting just like original note. He would like to resume work on Wednesday if possible. He has concern for losing his job if he continues out of work.   OK for note with restrictions?

## 2020-05-14 NOTE — Telephone Encounter (Signed)
OK thanks. Can resume work 2 days after [employer] thanks

## 2020-05-14 NOTE — Telephone Encounter (Signed)
Pt called stating he needs his work note extended until 05/24/20 as that's when he gets an injection; pt would like to have this emailed   [email or web address] [phone]

## 2020-05-15 NOTE — Telephone Encounter (Signed)
OK - thanks

## 2020-05-15 NOTE — Telephone Encounter (Signed)
Note entered and emailed per patient request. °

## 2020-05-24 ENCOUNTER — Other Ambulatory Visit: Payer: Self-pay

## 2020-05-24 ENCOUNTER — Ambulatory Visit (INDEPENDENT_AMBULATORY_CARE_PROVIDER_SITE_OTHER): Payer: Worker's Compensation | Admitting: Physical Medicine and Rehabilitation

## 2020-05-24 ENCOUNTER — Encounter: Payer: Self-pay | Admitting: Physical Medicine and Rehabilitation

## 2020-05-24 ENCOUNTER — Ambulatory Visit: Payer: Self-pay

## 2020-05-24 VITALS — BP 102/72 | HR 72

## 2020-05-24 DIAGNOSIS — M5116 Intervertebral disc disorders with radiculopathy, lumbar region: Secondary | ICD-10-CM

## 2020-05-24 MED ORDER — METHYLPREDNISOLONE ACETATE 80 MG/ML IJ SUSP
80.0000 mg | Freq: Once | INTRAMUSCULAR | Status: AC
  Administered 2020-05-24: 80 mg

## 2020-05-24 NOTE — Procedures (Signed)
Lumbar Epidural Steroid Injection - Interlaminar Approach with Fluoroscopic Guidance  Patient: Samuel Duncan      Date of Birth: 06/08/1971 MRN: [id number] PCP: Ronnald Nian, MD      Visit Date: 05/24/2020   Universal Protocol:     Consent Given By: the patient  Position: PRONE  Additional Comments: Vital signs were monitored before and after the procedure. Patient was prepped and draped in the usual sterile fashion. The correct patient, procedure, and site was verified.   Injection Procedure Details:   Procedure diagnoses:  1. Radiculopathy due to lumbar intervertebral disc disorder      Meds Administered:  Meds ordered this encounter  Medications  . methylPREDNISolone acetate (DEPO-MEDROL) injection 80 mg     Laterality: Right  Location/Site:  L5-S1  Needle size: 20 G  Needle type: Tuohy  Needle Placement: Paramedian epidural  Findings:   -Comments: Excellent flow of contrast into the epidural space.  Procedure Details: Using a paramedian approach from the side mentioned above, the region overlying the inferior lamina was localized under fluoroscopic visualization and the soft tissues overlying this structure were infiltrated with 4 ml. of 1% Lidocaine without Epinephrine. The Tuohy needle was inserted into the epidural space using a paramedian approach.   The epidural space was localized using loss of resistance along with counter oblique bi-planar fluoroscopic views.  After negative aspirate for air, blood, and CSF, a 2 ml. volume of Isovue-250 was injected into the epidural space and the flow of contrast was observed. Radiographs were obtained for documentation purposes.    The injectate was administered into the level noted above.   Additional Comments:  The patient tolerated the procedure well Dressing: 2 x 2 sterile gauze and Band-Aid    Post-procedure details: Patient was observed during the procedure. Post-procedure instructions were  reviewed.  Patient left the clinic in stable condition.

## 2020-05-24 NOTE — Progress Notes (Signed)
Pt state lower back pain that travels to both legs. Pt state sitting and standing for a long time makes the pain worse. Pt state bending or lifting makes the pain worse. Pt state he has to move around to ease the pain.  Numeric Pain Rating Scale and Functional Assessment Average Pain 7   In the last MONTH (on 0-10 scale) has pain interfered with the following?  1. General activity like being  able to carry out your everyday physical activities such as walking, climbing stairs, carrying groceries, or moving a chair?  Rating(9)   +Driver, -BT, -Dye Allergies.

## 2020-05-24 NOTE — Progress Notes (Signed)
Valdis Bevill - 49 y.o. male MRN [id number]  Date of birth: Aug 18, 1970  Office Visit Note: Visit Date: 05/24/2020 PCP: Ronnald Nian, MD Referred by: Ronnald Nian, MD  Subjective: Chief Complaint  Patient presents with  . Lower Back - Pain  . Left Leg - Pain  . Right Leg - Pain   HPI:  Olvin Rohr is a 49 y.o. male who comes in today at the request of Dr. Annell Greening for planned Right L5-S1 Lumbar epidural steroid injection with fluoroscopic guidance.  The patient has failed conservative care including home exercise, medications, time and activity modification.  This injection will be diagnostic and hopefully therapeutic.  Please see requesting physician notes for further details and justification.  MRI reviewed with images and spine model.  MRI reviewed in the note below.  Actually saw the patient has a consultation from Dr. Susann Givens his primary care physician back in 2019.  Those notes were reviewed.  Patient with new MRI reviewed with him today.  Degenerative changes at L5-S1 disc height loss and facet arthritis.  A lot of pain with prolonged sitting but also prolonged standing bending and twisting.  Rates his pain as a 7 out of 10.  Has had physical therapy that did help his upper back pain status post work-related injury but has not helped his lower back pain.  Had one episode of symptoms into the right calf.  Likely has some axial pain from facet arthritis at L5-S1 as well.  ROS Otherwise per HPI.  Assessment & Plan: Visit Diagnoses:  1. Radiculopathy due to lumbar intervertebral disc disorder     Plan: No additional findings.   Meds & Orders:  Meds ordered this encounter  Medications  . methylPREDNISolone acetate (DEPO-MEDROL) injection 80 mg    Orders Placed This Encounter  Procedures  . XR C-ARM NO REPORT  . Epidural Steroid injection    Follow-up: Return for Annell Greening, MD.   Procedures: No procedures performed  Lumbar Epidural Steroid Injection  - Interlaminar Approach with Fluoroscopic Guidance  Patient: Jibran Crookshanks      Date of Birth: 29-Nov-1970 MRN: [id number] PCP: Ronnald Nian, MD      Visit Date: 05/24/2020   Universal Protocol:     Consent Given By: the patient  Position: PRONE  Additional Comments: Vital signs were monitored before and after the procedure. Patient was prepped and draped in the usual sterile fashion. The correct patient, procedure, and site was verified.   Injection Procedure Details:   Procedure diagnoses:  1. Radiculopathy due to lumbar intervertebral disc disorder      Meds Administered:  Meds ordered this encounter  Medications  . methylPREDNISolone acetate (DEPO-MEDROL) injection 80 mg     Laterality: Right  Location/Site:  L5-S1  Needle size: 20 G  Needle type: Tuohy  Needle Placement: Paramedian epidural  Findings:   -Comments: Excellent flow of contrast into the epidural space.  Procedure Details: Using a paramedian approach from the side mentioned above, the region overlying the inferior lamina was localized under fluoroscopic visualization and the soft tissues overlying this structure were infiltrated with 4 ml. of 1% Lidocaine without Epinephrine. The Tuohy needle was inserted into the epidural space using a paramedian approach.   The epidural space was localized using loss of resistance along with counter oblique bi-planar fluoroscopic views.  After negative aspirate for air, blood, and CSF, a 2 ml. volume of Isovue-250 was injected into the epidural space and the flow  of contrast was observed. Radiographs were obtained for documentation purposes.    The injectate was administered into the level noted above.   Additional Comments:  The patient tolerated the procedure well Dressing: 2 x 2 sterile gauze and Band-Aid    Post-procedure details: Patient was observed during the procedure. Post-procedure instructions were reviewed.  Patient left the clinic in  stable condition.     Clinical History: Rachel Moulds, DO - 04/13/2020  Formatting of this note might be different from the original.  MRI lumbar spine:   TECHNIQUE: Sagittal and axial T1 and T2-weighted sequences were performed. Additional sagittal STIR images were performed.   INDICATION: Lumbar strain   COMPARISON: None   FINDINGS:  # Lumbar alignment is preserved.  # Vertebral body heights are well maintained.  # The marrow signal intensity is normal.  # Conus terminates at T12-L1 without evidence of tethering.  # Nerve roots appear normal.  # Incidental findings: None.    # L1-2: Normal.  #   # L2-3: Normal.  #   # L3-4: Normal.  #   # L4-5: Normal.  #   # L5-S1: Mild degenerative disc disease. There is facet arthropathy and rightward asymmetric disc bulging causing lateral recess stenosis with mild right neuroforaminal narrowing. No significant central canal narrowing.    IMPRESSION:   Lumbar spondylosis L5-S1 with rightward asymmetric disc bulging causing right lateral recess stenosis with mild rightneuroforaminal narrowing. No significant central canal stenosis.   Electronically Signed by: Abner Greenspan Specimen Collected: 04/13/20 11:51 AM    ------------ MRI LUMBAR SPINE WITHOUT CONTRAST    TECHNIQUE:  Multiplanar, multisequence MR imaging of the lumbar spine was  performed. No intravenous contrast was administered.    COMPARISON: Radiography 02/01/2018    FINDINGS:  Segmentation: 5 lumbar type vertebral bodies.    Alignment: Mild dextrocurvature    Vertebrae: No fracture, evidence of discitis, or bone lesion.    Conus medullaris and cauda equina: Conus extends to the L1-2 level.  Conus and cauda equina appear normal.    Paraspinal and other soft tissues: Negative    Disc levels:    T12- L1: Unremarkable.    L1-L2: Unremarkable.    L2-L3: Unremarkable.    L3-L4: Unremarkable.    L4-L5:  Unremarkable.    L5-S1:Disc narrowing worse towards the right. There is relatively  mild disc bulging and far-lateral spurring. Negative facets. No  impingement    IMPRESSION:  1. 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  2. Mild dextroscoliosis.      Electronically Signed  By: Marnee Spring M.D.  On: 03/29/2018 14:39     Objective:  VS:  HT:    WT:   BMI:     BP:102/72  HR:72bpm  TEMP: ( )  RESP:  Physical Exam Constitutional:      General: He is not in acute distress.    Appearance: Normal appearance. He is not ill-appearing.  HENT:     Head: Normocephalic and atraumatic.     Right Ear: External ear normal.     Left Ear: External ear normal.  Eyes:     Extraocular Movements: Extraocular movements intact.  Cardiovascular:     Rate and Rhythm: Normal rate.     Pulses: Normal pulses.  Abdominal:     General: There is no distension.     Palpations: Abdomen is soft.  Musculoskeletal:        General: No tenderness or signs of injury.  Right lower leg: No edema.     Left lower leg: No edema.     Comments: Patient has good distal strength without clonus. Patient somewhat slow to rise from a seated position to full extension.  There is concordant low back pain with facet loading and lumbar spine extension rotation.  There are no definitive trigger points but the patient is somewhat tender across the lower back and PSIS.  There is no pain with hip rotation.   Skin:    Findings: No erythema or rash.  Neurological:     General: No focal deficit present.     Mental Status: He is alert and oriented to person, place, and time.     Sensory: No sensory deficit.     Motor: No weakness or abnormal muscle tone.     Coordination: Coordination normal.  Psychiatric:        Mood and Affect: Mood normal.        Behavior: Behavior normal.      Imaging: No results found.

## 2020-06-06 ENCOUNTER — Encounter: Payer: Self-pay | Admitting: Orthopaedic Surgery

## 2020-06-06 ENCOUNTER — Ambulatory Visit (INDEPENDENT_AMBULATORY_CARE_PROVIDER_SITE_OTHER): Payer: Worker's Compensation | Admitting: Orthopaedic Surgery

## 2020-06-06 ENCOUNTER — Other Ambulatory Visit: Payer: Self-pay

## 2020-06-06 VITALS — BP 110/70 | Ht 74.0 in | Wt 174.0 lb

## 2020-06-06 DIAGNOSIS — M5126 Other intervertebral disc displacement, lumbar region: Secondary | ICD-10-CM | POA: Diagnosis not present

## 2020-06-06 NOTE — Progress Notes (Signed)
Office Visit Note   Patient: Samuel Duncan           Date of Birth: Apr 27, 1971           MRN: [id number] Visit Date: 06/06/2020              Requested by: Ronnald Nian, MD [address] [place],  [place] [address] PCP: Ronnald Nian, MD   Assessment & Plan: Visit Diagnoses:  1. Protrusion of lumbar intervertebral disc     Plan: Slip given that he cannot work lift up to 30pounds.  These are in effect for 1 month.  We reviewed the MRI scan results and disc desiccation with facet arthropathy.  I discussed with him that I plan to release him for regular work in 1 month and he is looking for other employment that would be easier on his back without as much repetitive heavy lifting as the job he was doing when he got injured.  Patient had an MRI on 03/29/2018 prior to his on-the-job injury with similar findings of mild disc bulge with some spurring.  I do not anticipate permanent impairment.  Follow-Up Instructions: Return in about 1 month (around 07/06/2020).   Orders:  No orders of the defined types were placed in this encounter.  No orders of the defined types were placed in this encounter.     Procedures: No procedures performed   Clinical Data: No additional findings.   Subjective: Chief Complaint  Patient presents with  . Lower Back - Follow-up    OTJI 01/19/2020    HPI 49 year old male returns post epidural injection 05/24/2020 with Dr. Alvester Morin.  Date of on-the-job injury was 01/19/2020 an MRI scan showed lumbar disc protrusion.  Patient states he felt good wrap the injection for few hours and then had increase in back pain and actually felt worse right after.  He has had back pain with pain that runs into his left hip sometimes running down his right leg to his calf.  He has been using 2 walking sticks when he walks long distance [place]  MRI scan showed lateral recess narrowing at L5-S1 and foraminal narrowing rated at mild.  All  other levels above L5-S1 were normal.  Patient has no headache problems.  No bowel bladder symptoms.  Review of Systems updated unchanged from previous visits.   Objective: Vital Signs: BP 110/70   Ht 6\' 2"  (1.88 m)   Wt 174 lb (78.9 kg)   BMI 22.34 kg/m   Physical Exam Constitutional:      Appearance: He is well-developed.  HENT:     Head: Normocephalic and atraumatic.  Eyes:     Pupils: Pupils are equal, round, and reactive to light.  Neck:     Thyroid: No thyromegaly.     Trachea: No tracheal deviation.  Cardiovascular:     Rate and Rhythm: Normal rate.  Pulmonary:     Effort: Pulmonary effort is normal.     Breath sounds: No wheezing.  Abdominal:     General: Bowel sounds are normal.     Palpations: Abdomen is soft.  Skin:    General: Skin is warm and dry.     Capillary Refill: Capillary refill takes less than 2 seconds.  Neurological:     Mental Status: He is alert and oriented to person, place, and time.  Psychiatric:        Behavior: Behavior normal.  Thought Content: Thought content normal.        Judgment: Judgment normal.     Ortho Exam patient has negative straight leg raising 90 degrees.  He can walk on his toes and his heels.  Negative logroll to the hips.  Specialty Comments:  No specialty comments available.  Imaging: No results found.   PMFS History: Patient Active Problem List   Diagnosis Date Noted  . Protrusion of lumbar intervertebral disc 05/01/2020   Past Medical History:  Diagnosis Date  . GERD (gastroesophageal reflux disease) 03/17/2016   [facility] ENT Notes     Family History  Problem Relation Age of Onset  . Heart disease Mother   . Dementia Mother   . Hypertension Father   . Migraines Sister   . Breast cancer Maternal Grandmother   . Heart disease Maternal Grandfather   . Other Paternal Grandmother        brain tumor  . Alcoholism Paternal Grandfather   . Diabetes Maternal Aunt   . Hypertension Maternal Aunt    . Hypertension Paternal Aunt   . Breast cancer Maternal Aunt   . Throat cancer Paternal Uncle   . Dementia Paternal Aunt        x 2  . Alzheimer's disease Paternal Uncle     Past Surgical History:  Procedure Laterality Date  . GANGLION CYST EXCISION Left    wrist  . INGUINAL HERNIA REPAIR Right   . TENDON REPAIR Right    middle finger   Social History   Occupational History  . Occupation: Engineer, drilling  Tobacco Use  . Smoking status: Never Smoker  . Smokeless tobacco: Never Used  Vaping Use  . Vaping Use: Never used  Substance and Sexual Activity  . Alcohol use: No  . Drug use: No  . Sexual activity: Never

## 2020-06-14 ENCOUNTER — Other Ambulatory Visit (INDEPENDENT_AMBULATORY_CARE_PROVIDER_SITE_OTHER): Payer: PRIVATE HEALTH INSURANCE

## 2020-06-14 ENCOUNTER — Encounter: Payer: Self-pay | Admitting: Family Medicine

## 2020-06-14 ENCOUNTER — Telehealth: Payer: Self-pay

## 2020-06-14 ENCOUNTER — Telehealth (INDEPENDENT_AMBULATORY_CARE_PROVIDER_SITE_OTHER): Payer: PRIVATE HEALTH INSURANCE | Admitting: Family Medicine

## 2020-06-14 ENCOUNTER — Other Ambulatory Visit: Payer: Self-pay

## 2020-06-14 VITALS — Wt 174.0 lb

## 2020-06-14 DIAGNOSIS — R519 Headache, unspecified: Secondary | ICD-10-CM

## 2020-06-14 DIAGNOSIS — J029 Acute pharyngitis, unspecified: Secondary | ICD-10-CM

## 2020-06-14 DIAGNOSIS — R61 Generalized hyperhidrosis: Secondary | ICD-10-CM

## 2020-06-14 DIAGNOSIS — J3489 Other specified disorders of nose and nasal sinuses: Secondary | ICD-10-CM

## 2020-06-14 DIAGNOSIS — R059 Cough, unspecified: Secondary | ICD-10-CM

## 2020-06-14 LAB — POCT INFLUENZA A/B
Influenza A, POC: NEGATIVE
Influenza B, POC: NEGATIVE

## 2020-06-14 LAB — POC COVID19 BINAXNOW: SARS Coronavirus 2 Ag: NEGATIVE

## 2020-06-14 MED ORDER — BENZONATATE 200 MG PO CAPS: 200.0000 mg | ORAL_CAPSULE | Freq: Two times a day (BID) | ORAL | 0 refills | Status: DC | PRN

## 2020-06-14 MED ORDER — AMOXICILLIN 875 MG PO TABS: 875.0000 mg | ORAL_TABLET | Freq: Two times a day (BID) | ORAL | 0 refills | Status: DC

## 2020-06-14 NOTE — Progress Notes (Signed)
Please let him know that his flu and rapid Covid tests are negative. I will send in an antibiotic and the cough medication, Tessalon for him to take. Let us know if he is not back to baseline when he completes the antibiotic or if he is getting worse.

## 2020-06-14 NOTE — Progress Notes (Signed)
   Subjective:  Documentation for virtual audio, phone only per patient.   The patient was located at home. 2 patient identifiers used.  The provider was located in the office. The patient did consent to this visit and is aware of possible charges through their insurance for this visit.  The other persons participating in this telemedicine service were none. Time spent on call was 12 minutes and in review of previous records 15 minutes total.  This virtual service is not related to other E/M service within previous 7 days.   Patient ID: Samuel Duncan, male    DOB: 1971-02-04, 49 y.o.   MRN: [id number]  HPI Chief Complaint  Patient presents with  . sick    fever at night, sweating cough,headache sicne saturday,nasal congestion, was watching grandson that was sick-and was out of daycare.    Complains of a 6 day history of scratchy throat, fever at night with sweats, headache, and dry cough. He has purulent nasal drainage.  Headache with cough only.   No loss of taste or smell.  Denies chest pain, palpitations, shortness of breath.   Taking Dayquil and Nyquil.    His grandson was sick and he was caring for him prior to his symptoms. States his whole family was tested for Covid and they were negative.  His daughter in law had Covid 6-8 weeks ago.    He is not vaccinated for Covid.     Review of Systems Pertinent positives and negatives in the history of present illness.     Objective:   Physical Exam Wt 174 lb (78.9 kg)   BMI 22.34 kg/m   Alert and oriented in no acute distress. Normal speech, mood and thought process     Assessment & Plan:  Purulent nasal discharge - Plan: Influenza A/B, POC COVID-19, Novel Coronavirus, NAA ([facility])  Acute pharyngitis, unspecified etiology - Plan: Influenza A/B, POC COVID-19, Novel Coronavirus, NAA ([facility])  Cough - Plan: Influenza A/B, POC COVID-19, Novel Coronavirus, NAA ([facility])  Acute nonintractable headache,  unspecified headache type - Plan: Influenza A/B, POC COVID-19, Novel Coronavirus, NAA ([facility])  Night sweats - Plan: Influenza A/B, POC COVID-19, Novel Coronavirus, NAA ([facility])  Is negative for flu and rapid Covid. Amoxicillin and Tessalon prescribed. Discussed symptomatic treatment. He will follow-up if worsening or not back to baseline after completing the antibiotic

## 2020-06-17 LAB — NOVEL CORONAVIRUS, NAA: SARS-CoV-2, NAA: NOT DETECTED

## 2020-06-17 NOTE — Progress Notes (Signed)
His PCR Covid test is also negative.

## 2020-07-02 ENCOUNTER — Telehealth: Payer: Self-pay

## 2020-07-02 MED ORDER — AMOXICILLIN 875 MG PO TABS
875.0000 mg | ORAL_TABLET | Freq: Two times a day (BID) | ORAL | 0 refills | Status: DC
Start: 2020-07-02 — End: 2021-04-25

## 2020-07-02 NOTE — Telephone Encounter (Signed)
Pt states he is some better but his puppy got a hold of his antibiotics and he wasn't able to take but about half of his antibiotics.  Puppy chewed the bottle up, he didn't digest them.  He would like refill, still having green & white mucous.  Sinus drainage, no cough.  He still having sinus issues.

## 2020-07-02 NOTE — Telephone Encounter (Signed)
Ok to give him one refill of Amoxicillin

## 2020-07-02 NOTE — Telephone Encounter (Signed)
Sent in med

## 2020-07-06 ENCOUNTER — Encounter: Payer: Self-pay | Admitting: Orthopaedic Surgery

## 2020-07-06 ENCOUNTER — Other Ambulatory Visit: Payer: Self-pay

## 2020-07-06 ENCOUNTER — Ambulatory Visit (INDEPENDENT_AMBULATORY_CARE_PROVIDER_SITE_OTHER): Payer: Worker's Compensation | Admitting: Orthopaedic Surgery

## 2020-07-06 VITALS — BP 123/74 | HR 85 | Ht 74.0 in | Wt 174.0 lb

## 2020-07-06 DIAGNOSIS — M5126 Other intervertebral disc displacement, lumbar region: Secondary | ICD-10-CM

## 2020-07-06 NOTE — Progress Notes (Signed)
Office Visit Note   Patient: Samuel Duncan           Date of Birth: [birth year]           MRN: [id number] Visit Date: 07/06/2020              Requested by: Ronnald Nian, MD [address] [place],  [place] [address] PCP: Ronnald Nian, MD   Assessment & Plan: Visit Diagnoses: No diagnosis found.  Plan: Continue work with restriction no lifting greater than 30 pounds x 1 month then he can resume regular work without restrictions.  Today's visit we again reviewed his previous MRI that was done in 2019 report and images.  We also reviewed report from our recent MRI done at [facility] after his on-the-job injury.  He has had an epidural which did not help.  Work slip given for continued restriction for 1 month then regular work no restrictions and I plan to see him for final visit in 3 months.  I discussed with him I do not think functional past evaluation at this point would add anything to his care or change outlined plan.  Follow-Up Instructions: Return in about 3 months (around 10/04/2020).   Orders:  No orders of the defined types were placed in this encounter.  No orders of the defined types were placed in this encounter.     Procedures: No procedures performed   Clinical Data: No additional findings.   Subjective: Chief Complaint  Patient presents with  . Lower Back - Pain, Follow-up    OTJI 01/19/2020    HPI 49 year old male returns for follow-up of on-the-job injury 01/19/2020.  Patient had been on restrictions no lifting greater than 30 pounds.  He said he has had 2 episodes where he fell.  He was walking try to put his right leg down felt pain shoot in his leg.  He did not break anything when he fell.  He states he has problems with turning twisting repetitive bending.  MRI scan 2019 and report from recent scan this year after his injury was reviewed with him again today which shows some disc desiccation L5-S1 and the new scan shows some paracentral protrusion  on the right at L5-S1 without significant compression.  Review of Systems 14 point review systems updated from previous visits and unchanged.   Objective: Vital Signs: BP 123/74   Pulse 85   Ht 6\' 2"  (1.88 m)   Wt 174 lb (78.9 kg)   BMI 22.34 kg/m   Physical Exam Constitutional:      Appearance: He is well-developed.  HENT:     Head: Normocephalic and atraumatic.  Eyes:     Pupils: Pupils are equal, round, and reactive to light.  Neck:     Thyroid: No thyromegaly.     Trachea: No tracheal deviation.  Cardiovascular:     Rate and Rhythm: Normal rate.  Pulmonary:     Effort: Pulmonary effort is normal.     Breath sounds: No wheezing.  Abdominal:     General: Bowel sounds are normal.     Palpations: Abdomen is soft.  Skin:    General: Skin is warm and dry.     Capillary Refill: Capillary refill takes less than 2 seconds.  Neurological:     Mental Status: He is alert and oriented to person, place, and time.  Psychiatric:        Behavior: Behavior normal.        Thought Content: Thought  content normal.        Judgment: Judgment normal.     Ortho Exam patient is able to heel and toe walk.  Negative straight leg raising 90degrees.  Knees reach full extension.  No rash over exposed skin.  Specialty Comments:  No specialty comments available.  Imaging: No results found.   PMFS History: Patient Active Problem List   Diagnosis Date Noted  . Protrusion of lumbar intervertebral disc 05/01/2020   Past Medical History:  Diagnosis Date  . GERD (gastroesophageal reflux disease) 03/17/2016   [facility] ENT Notes     Family History  Problem Relation Age of Onset  . Heart disease Mother   . Dementia Mother   . Hypertension Father   . Migraines Sister   . Breast cancer Maternal Grandmother   . Heart disease Maternal Grandfather   . Other Paternal Grandmother        brain tumor  . Alcoholism Paternal Grandfather   . Diabetes Maternal Aunt   . Hypertension Maternal  Aunt   . Hypertension Paternal Aunt   . Breast cancer Maternal Aunt   . Throat cancer Paternal Uncle   . Dementia Paternal Aunt        x 2  . Alzheimer's disease Paternal Uncle     Past Surgical History:  Procedure Laterality Date  . GANGLION CYST EXCISION Left    wrist  . INGUINAL HERNIA REPAIR Right   . TENDON REPAIR Right    middle finger   Social History   Occupational History  . Occupation: Engineer, drilling  Tobacco Use  . Smoking status: Never Smoker  . Smokeless tobacco: Never Used  Vaping Use  . Vaping Use: Never used  Substance and Sexual Activity  . Alcohol use: No  . Drug use: No  . Sexual activity: Never

## 2020-07-12 ENCOUNTER — Telehealth: Payer: Self-pay | Admitting: Radiology

## 2020-07-12 NOTE — Telephone Encounter (Signed)
Yes OK to do thanks 

## 2020-07-12 NOTE — Telephone Encounter (Signed)
New work note entered into system.

## 2020-07-12 NOTE — Telephone Encounter (Signed)
Please see message below from case manager and advise. Patient was seen on 07/06/2020 and you had him on restrictions x one month, then return to full duty with no restrictions. Case manager is asking for a specific date. Is 08/06/2020 ok? That would be a Monday.    Can you please see message attached below from [facility]. Do you think Dr. Ophelia Charter will do a specific note regarding the return to work for Mr. Baird. Thx Tisha   [email or web address],[email or web address]  Wed 07/11/2020 7:08 PM   HI Tisha, sorry to bother you but can you have Dr. Ophelia Charter put a specific Full Duty return to work date - like 08/06/20   The adjuster is going to hassle me if I Jakiah't give her a specific date  J    Samara Deist RN CCM  Medical Case Manager

## 2020-10-05 ENCOUNTER — Ambulatory Visit: Payer: Self-pay | Admitting: Orthopaedic Surgery

## 2020-10-09 ENCOUNTER — Ambulatory Visit (INDEPENDENT_AMBULATORY_CARE_PROVIDER_SITE_OTHER): Payer: Worker's Compensation | Admitting: Orthopaedic Surgery

## 2020-10-09 ENCOUNTER — Encounter: Payer: Self-pay | Admitting: Orthopaedic Surgery

## 2020-10-09 ENCOUNTER — Other Ambulatory Visit: Payer: Self-pay

## 2020-10-09 VITALS — BP 123/76 | HR 72 | Ht 74.0 in | Wt 174.0 lb

## 2020-10-09 DIAGNOSIS — M5126 Other intervertebral disc displacement, lumbar region: Secondary | ICD-10-CM | POA: Diagnosis not present

## 2020-10-09 NOTE — Progress Notes (Signed)
Office Visit Note   Patient: Samuel Duncan           Date of Birth: 01-10-1971           MRN: [id number] Visit Date: 10/09/2020              Requested by: Ronnald Nian, MD [address] [place],  [place] [address] PCP: Ronnald Nian, MD   Assessment & Plan: Visit Diagnoses:  1. Protrusion of lumbar intervertebral disc     Plan: work note given for regular work. No restrictions. He states he was told he had to go for a 2nd opinion at [facility] .   Follow her PRN.   Follow-Up Instructions: No follow-ups on file.   Orders:  No orders of the defined types were placed in this encounter.  No orders of the defined types were placed in this encounter.     Procedures: No procedures performed   Clinical Data: No additional findings.   Subjective: Chief Complaint  Patient presents with  . Lower Back - Pain    HPI 50 year old male returns.  When I last saw him he was given a work note stating he could work for 1 month and no lifting more than 30 pounds and after that point he could resume regular work full work activities without restrictions.  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  Patient has an Pensions consultant.  MRI scan showed some asymmetric disc bulge at L5-S1 on the right without compression and all other levels above L5-S1 in the lumbar spine were normal.  His scan was done at [facility] imaging on 04/13/2020.  Review of Systems patient that he had cold since I last saw him that resolved.  No other change in 14 point review of systems.   Objective: Vital Signs: BP 123/76 (BP Location: Left Arm, Patient Position: Sitting)   Pulse 72   Ht 6\' 2"  (1.88 m)   Wt 174 lb (78.9 kg)   BMI 22.34 kg/m   Physical Exam Constitutional:      Appearance: He is well-developed and well-nourished.  HENT:     Head: Normocephalic and atraumatic.  Eyes:     Extraocular  Movements: EOM normal.     Pupils: Pupils are equal, round, and reactive to light.  Neck:     Thyroid: No thyromegaly.     Trachea: No tracheal deviation.  Cardiovascular:     Rate and Rhythm: Normal rate.  Pulmonary:     Effort: Pulmonary effort is normal.     Breath sounds: No wheezing.  Abdominal:     General: Bowel sounds are normal.     Palpations: Abdomen is soft.  Skin:    General: Skin is warm and dry.     Capillary Refill: Capillary refill takes less than 2 seconds.  Neurological:     Mental Status: He is alert and oriented to person, place, and time.  Psychiatric:        Mood and Affect: Mood and affect normal.        Behavior: Behavior normal.        Thought Content: Thought content normal.        Judgment: Judgment normal.     Ortho Exam patient has normal heel toe gait with ambulation.  He is able walk on his toes he complains of pain in his left leg when he  tries to heel walk on the left.  He is able do it on the right.  Anterior tib on the right is strong with EHL good strength.  On the left with ankle dorsiflexion EHL he has gastrocsoleus contraction.  No rash over exposed skin.  Negative logroll the hips.  Specialty Comments:  No specialty comments available.  Imaging: No results found.   PMFS History: Patient Active Problem List   Diagnosis Date Noted  . Protrusion of lumbar intervertebral disc 05/01/2020   Past Medical History:  Diagnosis Date  . GERD (gastroesophageal reflux disease) 03/17/2016   [facility] ENT Notes     Family History  Problem Relation Age of Onset  . Heart disease Mother   . Dementia Mother   . Hypertension Father   . Migraines Sister   . Breast cancer Maternal Grandmother   . Heart disease Maternal Grandfather   . Other Paternal Grandmother        brain tumor  . Alcoholism Paternal Grandfather   . Diabetes Maternal Aunt   . Hypertension Maternal Aunt   . Hypertension Paternal Aunt   . Breast cancer Maternal Aunt   .  Throat cancer Paternal Uncle   . Dementia Paternal Aunt        x 2  . Alzheimer's disease Paternal Uncle     Past Surgical History:  Procedure Laterality Date  . GANGLION CYST EXCISION Left    wrist  . INGUINAL HERNIA REPAIR Right   . TENDON REPAIR Right    middle finger   Social History   Occupational History  . Occupation: Engineer, drilling  Tobacco Use  . Smoking status: Never Smoker  . Smokeless tobacco: Never Used  Vaping Use  . Vaping Use: Never used  Substance and Sexual Activity  . Alcohol use: No  . Drug use: No  . Sexual activity: Never

## 2020-10-16 ENCOUNTER — Telehealth: Payer: Self-pay | Admitting: Orthopaedic Surgery

## 2020-10-16 NOTE — Telephone Encounter (Signed)
Form received. Sent to [employer].

## 2020-10-23 DIAGNOSIS — M47816 Spondylosis without myelopathy or radiculopathy, lumbar region: Secondary | ICD-10-CM | POA: Insufficient documentation

## 2020-10-23 DIAGNOSIS — G8929 Other chronic pain: Secondary | ICD-10-CM | POA: Insufficient documentation

## 2020-10-23 DIAGNOSIS — M5137 Other intervertebral disc degeneration, lumbosacral region: Secondary | ICD-10-CM | POA: Insufficient documentation

## 2020-10-23 DIAGNOSIS — M545 Low back pain, unspecified: Secondary | ICD-10-CM | POA: Insufficient documentation

## 2020-11-21 ENCOUNTER — Other Ambulatory Visit: Payer: Self-pay

## 2020-11-21 ENCOUNTER — Ambulatory Visit (INDEPENDENT_AMBULATORY_CARE_PROVIDER_SITE_OTHER): Payer: Worker's Compensation | Admitting: Orthopaedic Surgery

## 2020-11-21 ENCOUNTER — Encounter: Payer: Self-pay | Admitting: Orthopaedic Surgery

## 2020-11-21 VITALS — BP 113/72 | HR 67 | Ht 74.0 in | Wt 174.0 lb

## 2020-11-21 DIAGNOSIS — M5126 Other intervertebral disc displacement, lumbar region: Secondary | ICD-10-CM

## 2020-11-21 NOTE — Progress Notes (Signed)
Office Visit Note   Patient: Samuel Duncan           Date of Birth: [birth year]           MRN: [id number] Visit Date: 11/21/2020              Requested by: Ronnald Nian, MD [address] [place],  [place] [address] PCP: Ronnald Nian, MD   Assessment & Plan: Visit Diagnoses:  1. Protrusion of lumbar intervertebral disc     Plan: Today we reviewed previous treatment which have done with him several times and he actually voiced exactly what I told him on previous visits with the mild disc protrusion without severe compression.  Work slip given for regular work no restrictions.  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  After that he worked in [employer], hospital.  He has had maximal conservative treatment.  At this point no surgery is recommended.  Impairment rating for his injury with this protrusion is 2.5% of the back.  Patient is at [facility] office follow-up as needed work slip given for regular work without restrictions.   Follow-Up Instructions: Return if symptoms worsen or fail to improve.   Orders:  No orders of the defined types were placed in this encounter.  No orders of the defined types were placed in this encounter.     Procedures: No procedures performed   Clinical Data: No additional findings.   Subjective: Chief Complaint  Patient presents with  . Lower Back - Pain, Follow-up    HPI 50 year old male returns with ongoing problems follow-up after on-the-job injury.  He has had a second opinion at [facility].  The PA he recently saw with Dr. Christain Sacramento stated pain may be from the discogenic in origin possibly from the lumbar facets.  Recommended therapy stretching exercises core strengthening.  Possible lower lumbar facet injections, medial branch block ablation, last resort L5-S1 fusion.  Today we discussed with patient has been through  injections been through therapy.  He has had MRI scan.  He does have disc degeneration with mild bulge but no compression.  Review of Systems updated unchanged.   Objective: Vital Signs: BP 113/72   Pulse 67   Ht 6\' 2"  (1.88 m)   Wt 174 lb (78.9 kg)   BMI 22.34 kg/m   Physical Exam Constitutional:      Appearance: He is well-developed.  HENT:     Head: Normocephalic and atraumatic.  Eyes:     Pupils: Pupils are equal, round, and reactive to light.  Neck:     Thyroid: No thyromegaly.     Trachea: No tracheal deviation.  Cardiovascular:     Rate and Rhythm: Normal rate.  Pulmonary:     Effort: Pulmonary effort is normal.     Breath sounds: No wheezing.  Abdominal:     General: Bowel sounds are normal.     Palpations: Abdomen is soft.  Skin:    General: Skin is warm and dry.     Capillary Refill: Capillary refill takes less than 2 seconds.  Neurological:     Mental Status: He is alert and oriented to person, place, and time.  Psychiatric:        Behavior: Behavior normal.        Thought Content: Thought content normal.        Judgment: Judgment normal.  Ortho Exam patient has normal gait.  He can heel and toe walk.  Negative logroll of the hips no rash over exposed skin. Specialty Comments:  No specialty comments available.  Imaging: No results found.   PMFS History: Patient Active Problem List   Diagnosis Date Noted  . Protrusion of lumbar intervertebral disc 05/01/2020   Past Medical History:  Diagnosis Date  . GERD (gastroesophageal reflux disease) 03/17/2016   [facility] ENT Notes     Family History  Problem Relation Age of Onset  . Heart disease Mother   . Dementia Mother   . Hypertension Father   . Migraines Sister   . Breast cancer Maternal Grandmother   . Heart disease Maternal Grandfather   . Other Paternal Grandmother        brain tumor  . Alcoholism Paternal Grandfather   . Diabetes Maternal Aunt   . Hypertension Maternal Aunt   .  Hypertension Paternal Aunt   . Breast cancer Maternal Aunt   . Throat cancer Paternal Uncle   . Dementia Paternal Aunt        x 2  . Alzheimer's disease Paternal Uncle     Past Surgical History:  Procedure Laterality Date  . GANGLION CYST EXCISION Left    wrist  . INGUINAL HERNIA REPAIR Right   . TENDON REPAIR Right    middle finger   Social History   Occupational History  . Occupation: Engineer, drilling  Tobacco Use  . Smoking status: Never Smoker  . Smokeless tobacco: Never Used  Vaping Use  . Vaping Use: Never used  Substance and Sexual Activity  . Alcohol use: No  . Drug use: No  . Sexual activity: Never

## 2020-12-19 ENCOUNTER — Telehealth (INDEPENDENT_AMBULATORY_CARE_PROVIDER_SITE_OTHER): Payer: Self-pay | Admitting: Medical

## 2020-12-19 VITALS — Ht 74.0 in | Wt 184.0 lb

## 2020-12-19 DIAGNOSIS — J0100 Acute maxillary sinusitis, unspecified: Secondary | ICD-10-CM

## 2020-12-19 MED ORDER — AMOXICILLIN 875 MG PO TABS: 875.0000 mg | ORAL_TABLET | Freq: Two times a day (BID) | ORAL | 0 refills | Status: DC

## 2020-12-19 NOTE — Progress Notes (Signed)
Subjective:     Patient ID: Samuel Duncan, male   DOB: 1970/10/19, 50 y.o.   MRN: [id number]  This visit type was conducted due to national recommendations for restrictions regarding the COVID-19 Pandemic (e.g. social distancing) in an effort to limit this patient's exposure and mitigate transmission in our community.  Due to their co-morbid illnesses, this patient is at least at moderate risk for complications without adequate follow up.  This format is felt to be most appropriate for this patient at this time.    Documentation for virtual audio and video telecommunications through [place] encounter:  The patient was located at home. The provider was located in the office. The patient did consent to this visit and is aware of possible charges through their insurance for this visit.  The other persons participating in this telemedicine service were none. Time spent on call was 20 minutes and in review of previous records 20 minutes total.  This virtual service is not related to other E/M service within previous 7 days.   HPI Chief Complaint  Patient presents with  . Sinus Problem    Started Monday night-fever drainage, sinus pressure, headache    Virtual consult for sinus issues.   Was around grandson this past week. He was diagnosed with sinusitis.  He is 51yo.   He was exposed to him.   He picked up the same symptoms.  He notes sinus pressure, headache, low grade fever.   Fever finally broke. Has decreased appetite.  No NVD.  Has post nasal drainage.  Has some cough with the drainage, constantly blowing nose.  No loss of taste or smell.  Has slight sore throat.  Has had some chills with the fever.  No body aches.  His grandson had negative covid test.  He personally has not had covid test.  Using OTC cough/congestion medicaiton.  Nonsmoker.   No other aggravating or relieving factors. No other complaint.   Past Medical History:  Diagnosis Date  . GERD (gastroesophageal reflux  disease) 03/17/2016   [facility] ENT Notes     Current Outpatient Medications on File Prior to Visit  Medication Sig Dispense Refill  . omeprazole (PRILOSEC) 40 MG capsule TAKE 1 CAPSULE BY MOUTH TWICE A DAY 60 capsule 11  . amoxicillin (AMOXIL) 875 MG tablet Take 1 tablet (875 mg total) by mouth 2 (two) times daily. (Patient not taking: Reported on 12/19/2020) 20 tablet 0  . benzonatate (TESSALON) 200 MG capsule Take 1 capsule (200 mg total) by mouth 2 (two) times daily as needed for cough. (Patient not taking: Reported on 12/19/2020) 20 capsule 0   No current facility-administered medications on file prior to visit.     Review of Systems As in subjective    Objective:   Physical Exam Due to coronavirus pandemic stay at home measures, patient visit was virtual and they were not examined in person.   Ht 6\' 2"  (1.88 m)   Wt 184 lb (83.5 kg)   BMI 23.62 kg/m   Gen: wd, wn, nad Not significantly ill-appearing No dyspnea, no labored breathing Sounds congested somewhat in the head     Assessment:     Encounter Diagnosis  Name Primary?  . Acute non-recurrent maxillary sinusitis Yes       Plan:     We discussed symptoms and concerns.  He feels strongly that he picked this up from his grandson who was diagnosed with sinus infection and had a negative COVID test.  He declines COVID  test himself  Begin medication below, hydrate well, begin Mucinex DM over-the-counter, nasal saline flush.  If not much improved or new or worsening symptoms over the next 3 to 4 days then recheck  Neel was seen today for sinus problem.  Diagnoses and all orders for this visit:  Acute non-recurrent maxillary sinusitis  Other orders -     amoxicillin (AMOXIL) 875 MG tablet; Take 1 tablet (875 mg total) by mouth 2 (two) times daily.  f/u prn

## 2020-12-21 ENCOUNTER — Telehealth: Payer: PRIVATE HEALTH INSURANCE | Admitting: Medical

## 2021-01-25 ENCOUNTER — Other Ambulatory Visit: Payer: Self-pay

## 2021-01-25 ENCOUNTER — Ambulatory Visit: Payer: Self-pay | Admitting: Family Medicine

## 2021-04-25 ENCOUNTER — Encounter: Payer: Self-pay | Admitting: Family Medicine

## 2021-04-25 ENCOUNTER — Ambulatory Visit (INDEPENDENT_AMBULATORY_CARE_PROVIDER_SITE_OTHER): Payer: 59 | Admitting: Family Medicine

## 2021-04-25 ENCOUNTER — Other Ambulatory Visit: Payer: Self-pay

## 2021-04-25 VITALS — BP 120/78 | HR 74 | Ht 74.0 in | Wt 179.0 lb

## 2021-04-25 DIAGNOSIS — M5137 Other intervertebral disc degeneration, lumbosacral region: Secondary | ICD-10-CM

## 2021-04-25 DIAGNOSIS — R490 Dysphonia: Secondary | ICD-10-CM | POA: Diagnosis not present

## 2021-04-25 DIAGNOSIS — K219 Gastro-esophageal reflux disease without esophagitis: Secondary | ICD-10-CM | POA: Diagnosis not present

## 2021-04-25 MED ORDER — METHOCARBAMOL 500 MG PO TABS: 500.0000 mg | ORAL_TABLET | Freq: Four times a day (QID) | ORAL | 1 refills | Status: AC

## 2021-04-25 MED ORDER — OMEPRAZOLE 40 MG PO CPDR: 40.0000 mg | DELAYED_RELEASE_CAPSULE | Freq: Two times a day (BID) | ORAL | 1 refills | Status: DC

## 2021-04-25 NOTE — Progress Notes (Signed)
   Subjective:    Patient ID: Samuel Duncan, male    DOB: 1970/12/17, 50 y.o.   MRN: [id number]  HPI Chief Complaint  Patient presents with   Gastroesophageal Reflux    Recently has gotten insurance, needs refill on omeprazole   Back Pain    Bulging lumbar disc states he has had since he was 19, recently pain has been radiating to right hip   He is patient who normally sees Dr. Susann Givens.  Here to request a refill on omeprazole.   GERD-reports taking omeprazole 40 mg bid and if he does not take it regularly he has severe symptoms and develops hoarseness.  He had an EGD by Dr. Christella Hartigan in 2018.  States he has not been back to his GI.  He has seen Dr. Ophelia Charter for low back pain and a spine specialist.  States he has chronic right low back pain that radiates to his hip and occasionally down the back of his leg to his knee.  States he has had injections and has tried several medications without any relief.  States he has tried PT in the past and does not want to do this again.  He has been using a heating pad.  States he is unable to do what he likes to do such as working on cars.  Requesting help with pain relief.  States he has taken Flexeril and tramadol in the past and these medications were too sedating.  He would like to still be able to function but have less pain.  Denies any changes in urinary or bowel habits or saddle anesthesia.  Denies fever, chills, dizziness, chest pain, palpitations, shortness of breath, abdominal pain, N/V/D, urinary symptoms, LE edema.    Review of Systems Pertinent positives and negatives in the history of present illness.     Objective:   Physical Exam BP 120/78   Pulse 74   Ht 6\' 2"  (1.88 m)   Wt 179 lb (81.2 kg)   SpO2 98%   BMI 22.98 kg/m   He has normal sensation and range of motion of his low back.  Bilateral leg strength is symmetric.  No LE edema.      Assessment & Plan:  Chronic GERD - Plan: Ambulatory referral to  Gastroenterology  Hoarseness - Plan: Ambulatory referral to Gastroenterology  DDD (degenerative disc disease), lumbosacral - Plan: methocarbamol (ROBAXIN) 500 MG tablet  Omeprazole refilled.  He will continue avoiding NSAIDs and triggers that worsen his reflux.  Referral back to GI per patient request. In regards to his chronic back pain, encouraged him to follow-up with his PCP and spine specialist.  Discussed using ice versus heat, and topical analgesic, stretching, Tylenol 1000 mg twice daily and I will also prescribe Robaxin for him to use as needed.  He is aware this medication may be sedating.  Declines PT states he has done this in the recent past without much relief.

## 2021-05-08 ENCOUNTER — Other Ambulatory Visit: Payer: Self-pay

## 2021-05-08 ENCOUNTER — Ambulatory Visit: Payer: 59 | Admitting: Family Medicine

## 2021-05-08 VITALS — BP 120/68 | HR 79 | Temp 98.3°F | Wt 177.8 lb

## 2021-05-08 DIAGNOSIS — Z1211 Encounter for screening for malignant neoplasm of colon: Secondary | ICD-10-CM | POA: Diagnosis not present

## 2021-05-08 DIAGNOSIS — K219 Gastro-esophageal reflux disease without esophagitis: Secondary | ICD-10-CM | POA: Diagnosis not present

## 2021-05-08 DIAGNOSIS — Z23 Encounter for immunization: Secondary | ICD-10-CM | POA: Diagnosis not present

## 2021-05-08 DIAGNOSIS — M5126 Other intervertebral disc displacement, lumbar region: Secondary | ICD-10-CM | POA: Diagnosis not present

## 2021-05-08 NOTE — Progress Notes (Signed)
   Subjective:    Patient ID: Samuel Duncan, male    DOB: [birth year], 50 y.o.   MRN: [id number]  HPI He is here for an interval evaluation.  He does have chronic back pain and has been seen by Dr. Ophelia Charter and also at [facility].  The consensus was that surgery was not an option.  He is apparently got some maximum benefit from physical therapy.  He has had 1 injection that he states really did not help much but did not go back for any further injections.  He is interested in trying that again. He does have reflux disease and was recently started on Prilosec.  His hoarse voice has improved since being placed on the Prilosec. He would like a flu shot but is not interested in COVID immunization.  He now has insurance.  Review of Systems     Objective:   Physical Exam Alert and in no distress otherwise not examined       Assessment & Plan:  Protrusion of lumbar intervertebral disc - Plan: Ambulatory referral to Orthopedic Surgery  Need for influenza vaccination - Plan: Flu Vaccine QUAD 6+ mos PF IM (Fluarix Quad PF)  Chronic GERD  Screening for colon cancer - Plan: Cologuard I will refer him back to Dr. Alvester Morin who he has seen in the past to see if further injections would be beneficial.  Flu shot given.  He will continue on Prilosec and if his hoarse voice goes away completely, further GI evaluation can be postponed.  We will also order Cologuard.  Recommend he set up for complete exam and sometime in the future.

## 2021-05-15 ENCOUNTER — Encounter: Payer: Self-pay | Admitting: Physical Medicine and Rehabilitation

## 2021-05-15 ENCOUNTER — Ambulatory Visit (INDEPENDENT_AMBULATORY_CARE_PROVIDER_SITE_OTHER): Payer: 59 | Admitting: Physical Medicine and Rehabilitation

## 2021-05-15 ENCOUNTER — Other Ambulatory Visit: Payer: Self-pay

## 2021-05-15 VITALS — BP 103/70 | HR 55

## 2021-05-15 DIAGNOSIS — G8929 Other chronic pain: Secondary | ICD-10-CM

## 2021-05-15 DIAGNOSIS — M545 Low back pain, unspecified: Secondary | ICD-10-CM

## 2021-05-15 DIAGNOSIS — M5136 Other intervertebral disc degeneration, lumbar region: Secondary | ICD-10-CM | POA: Diagnosis not present

## 2021-05-15 DIAGNOSIS — M47816 Spondylosis without myelopathy or radiculopathy, lumbar region: Secondary | ICD-10-CM

## 2021-05-15 NOTE — Progress Notes (Signed)
Samuel Duncan - 50 y.o. male MRN [id number]  Date of birth: 1971/02/03  Office Visit Note: Visit Date: 05/15/2021 PCP: Ronnald Nian, MD Referred by: Ronnald Nian, MD  Subjective: Chief Complaint  Patient presents with   Lower Back - Pain   Right Leg - Pain   HPI: Samuel Duncan is a 50 y.o. male who comes in today for evaluation of chronic, worsening and severe bilateral lower back pain rating to buttocks and right hip.  Patient had right L5-S1 interlaminar epidural steroid injection in 2021 which he reports helped him for approximately 15 minutes, however this injection also seemed to exacerbate diffuse thoracic pain.  Patient states pain is exacerbated by bending, walking and prolonged standing.  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  Patient reports some relief of pain with stretching exercises, Robaxin and rest.  Patient's lumbar MRI from 2021 exhibits facet arthropathy and right sided disc bulge causing lateral recess stenosis and mild right foraminal narrowing at L5-S1. No high grade spinal canal stenosis noted. Patient states he did attend formal physical therapy at [facility], however he feels this made his pain worse. 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 Patient states he is a very active person and is currently working full time job. Patient states he also has grand children and is having a hard time picking them up due to pain. Patient denies focal weakness, numbness and tingling. Patient denies recent trauma or falls.   Review of Systems  Musculoskeletal:  Positive for back pain.  Neurological:  Negative for tingling, sensory change, focal weakness and weakness.  All other systems reviewed and are negative. Otherwise per HPI.  Assessment & Plan: Visit  Diagnoses:    ICD-10-CM   1. Chronic bilateral low back pain without sciatica  M54.50 Ambulatory referral to Physical Medicine Rehab   G89.29     2. Other intervertebral disc degeneration, lumbar region  M51.36     3. Facet arthropathy, lumbar  M47.816        Plan: Findings:  Chronic, worsening and severe bilateral lower back pain radiating to right hip.  Patient continues to have excruciating pain despite good conservative therapies such as formal physical therapy, stretching exercises at home and use of medications.  Patient's clinical presentation and exam are consistent with both discogenic and lateral recess issues on the right and facet mediated pain.  L5-S1 interlaminar injection helped very briefly if anything.  We believe the next step is to perform diagnostic and hopefully therapeutic bilateral L5-S1 facet joint injections/medial branch blocks.  If patient does well with facet joint injections performed in a double block paradigm we will consider performing radiofrequency ablation.  If no real relief with the facet joint block I would look at a transforaminal injection at L5 or S1.  Patient given educational literature to take home and review today regarding radiofrequency ablation.  Patient encouraged to continue being active and to also perform stretching and strengthening exercises at home as tolerated.  No red flag symptoms noted today upon exam.   Meds & Orders: No orders of the defined types were placed in this encounter.   Orders Placed This Encounter  Procedures   Ambulatory referral to Physical Medicine Rehab     Follow-up: Return in about 1 week (around 05/22/2021) for  Bilateral L5-S1 facet joint injections.   Procedures: No procedures performed      Clinical History: Rachel Moulds, DO - 04/13/2020  Formatting of this note might be different from the original.  MRI lumbar spine:   TECHNIQUE: Sagittal and axial T1 and T2-weighted sequences were performed.  Additional sagittal STIR images were performed.   INDICATION: Lumbar strain   COMPARISON: None   FINDINGS:  #  Lumbar alignment is preserved.  #  Vertebral body heights are well maintained.  #  The marrow signal intensity is normal.  #  Conus terminates at T12-L1 without evidence of tethering.  #  Nerve roots appear normal.  #  Incidental findings: None.    #  L1-2: Normal.  #    #  L2-3: Normal.  #    #  L3-4: Normal.  #    #  L4-5: Normal.  #    #  L5-S1: Mild degenerative disc disease. There is facet arthropathy and rightward asymmetric disc bulging causing lateral recess stenosis with mild right neuroforaminal narrowing. No significant central canal narrowing.    IMPRESSION:   Lumbar spondylosis L5-S1 with rightward asymmetric disc bulging causing right lateral recess stenosis with mild right neuroforaminal narrowing. No significant central canal stenosis.   Electronically Signed by: Abner Greenspan Specimen Collected: 04/13/20 11:51 AM    ------------ MRI LUMBAR SPINE WITHOUT CONTRAST     TECHNIQUE:  Multiplanar, multisequence MR imaging of the lumbar spine was  performed. No intravenous contrast was administered.     COMPARISON:  Radiography 02/01/2018     FINDINGS:  Segmentation:  5 lumbar type vertebral bodies.     Alignment:  Mild dextrocurvature     Vertebrae:  No fracture, evidence of discitis, or bone lesion.     Conus medullaris and cauda equina: Conus extends to the L1-2 level.  Conus and cauda equina appear normal.     Paraspinal and other soft tissues: Negative     Disc levels:     T12- L1: Unremarkable.     L1-L2: Unremarkable.     L2-L3: Unremarkable.     L3-L4: Unremarkable.     L4-L5: Unremarkable.     L5-S1:Disc narrowing worse towards the right. There is relatively  mild disc bulging and far-lateral spurring. Negative facets. No  impingement     IMPRESSION:  1. 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  2. Mild  dextroscoliosis.        Electronically Signed    By: Marnee Spring M.D.    On: 03/29/2018 14:39   He reports that he has never smoked. He has never used smokeless tobacco. No results for input(s): HGBA1C, LABURIC in the last 8760 hours.  Objective:  VS:  HT:    WT:   BMI:     BP:103/70  HR:(!) 55bpm  TEMP: ( )  RESP:  Physical Exam Vitals and nursing note reviewed.  HENT:     Head: Normocephalic and atraumatic.     Right Ear: External ear normal.     Left Ear: External ear normal.     Nose: Nose normal.     Mouth/Throat:     Mouth: Mucous membranes are moist.  Eyes:     Extraocular Movements: Extraocular movements intact.  Cardiovascular:     Rate and Rhythm: Normal rate.     Pulses: Normal pulses.  Pulmonary:     Effort: Pulmonary effort is normal.  Abdominal:     General: Abdomen is flat. There  is no distension.  Musculoskeletal:        General: Tenderness present.     Cervical back: Normal range of motion.     Comments: Pt is slow to rise from seated position to standing. Pain noted upon facet loading. Strong distal strength without clonus, no pain upon palpation of greater trochanters. Sensation intact bilaterally. Walks independently, gait steady.     Skin:    General: Skin is warm and dry.     Capillary Refill: Capillary refill takes less than 2 seconds.  Neurological:     General: No focal deficit present.     Mental Status: He is alert and oriented to person, place, and time.  Psychiatric:        Mood and Affect: Mood normal.    Ortho Exam  Imaging: No results found.  Past Medical/Family/Surgical/Social History: Medications & Allergies reviewed per EMR, new medications updated. Patient Active Problem List   Diagnosis Date Noted   Chronic midline low back pain without sciatica 10/23/2020   DDD (degenerative disc disease), lumbosacral 10/23/2020   Lumbar spondylosis 10/23/2020   Protrusion of lumbar intervertebral disc 05/01/2020   Past Medical  History:  Diagnosis Date   GERD (gastroesophageal reflux disease) 03/17/2016   [facility] Notes    Family History  Problem Relation Age of Onset   Heart disease Mother    Dementia Mother    Hypertension Father    Migraines Sister    Breast cancer Maternal Grandmother    Heart disease Maternal Grandfather    Other Paternal Grandmother        brain tumor   Alcoholism Paternal Grandfather    Diabetes Maternal Aunt    Hypertension Maternal Aunt    Hypertension Paternal Aunt    Breast cancer Maternal Aunt    Throat cancer Paternal Uncle    Dementia Paternal Aunt        x 2   Alzheimer's disease Paternal Uncle    Past Surgical History:  Procedure Laterality Date   GANGLION CYST EXCISION Left    wrist   INGUINAL HERNIA REPAIR Right    TENDON REPAIR Right    middle finger   Social History   Occupational History   Occupation: Engineer, drilling  Tobacco Use   Smoking status: Never   Smokeless tobacco: Never  Vaping Use   Vaping Use: Never used  Substance and Sexual Activity   Alcohol use: No   Drug use: No   Sexual activity: Never

## 2021-05-15 NOTE — Progress Notes (Signed)
Pt state lower back pain that travels to his buttocks and right leg. Pt state laying down, walking and bending over makes the pain worse. Pt state he takes pain meds to help ease his pain.  Numeric Pain Rating Scale and Functional Assessment Average Pain 10 Pain Right Now 5 My pain is intermittent, sharp, burning, and aching Pain is worse with: walking, bending, and some activites Pain improves with: medication   In the last MONTH (on 0-10 scale) has pain interfered with the following?  1. General activity like being  able to carry out your everyday physical activities such as walking, climbing stairs, carrying groceries, or moving a chair?  Rating(7)  2. Relation with others like being able to carry out your usual social activities and roles such as  activities at home, at work and in your community. Rating(8)  3. Enjoyment of life such that you have  been bothered by emotional problems such as feeling anxious, depressed or irritable?  Rating(9)

## 2021-05-23 ENCOUNTER — Other Ambulatory Visit: Payer: Self-pay

## 2021-05-23 ENCOUNTER — Ambulatory Visit (INDEPENDENT_AMBULATORY_CARE_PROVIDER_SITE_OTHER): Payer: 59 | Admitting: Physical Medicine and Rehabilitation

## 2021-05-23 ENCOUNTER — Encounter: Payer: Self-pay | Admitting: Physical Medicine and Rehabilitation

## 2021-05-23 ENCOUNTER — Ambulatory Visit: Payer: Self-pay

## 2021-05-23 VITALS — BP 118/78 | HR 78

## 2021-05-23 DIAGNOSIS — M545 Low back pain, unspecified: Secondary | ICD-10-CM

## 2021-05-23 DIAGNOSIS — M47816 Spondylosis without myelopathy or radiculopathy, lumbar region: Secondary | ICD-10-CM

## 2021-05-23 DIAGNOSIS — G8929 Other chronic pain: Secondary | ICD-10-CM

## 2021-05-23 MED ORDER — BUPIVACAINE HCL 0.25 % IJ SOLN: 2.0000 mL | Freq: Once | INTRAMUSCULAR | Status: DC

## 2021-05-23 MED ORDER — METHYLPREDNISOLONE ACETATE 80 MG/ML IJ SUSP
80.0000 mg | Freq: Once | INTRAMUSCULAR | Status: AC
  Administered 2021-05-23: 80 mg

## 2021-05-23 NOTE — Progress Notes (Signed)
Pt state lower back pain that travels to his buttocks and right leg. Pt state laying down, walking and bending over makes the pain worse. Pt state he takes pain meds to help ease his pain.  Numeric Pain Rating Scale and Functional Assessment Average Pain 6   In the last MONTH (on 0-10 scale) has pain interfered with the following?  1. General activity like being  able to carry out your everyday physical activities such as walking, climbing stairs, carrying groceries, or moving a chair?  Rating(9)   +Driver, -BT, -Dye Allergies.

## 2021-05-23 NOTE — Patient Instructions (Signed)

## 2021-05-27 LAB — COLOGUARD: COLOGUARD: NEGATIVE

## 2021-06-03 ENCOUNTER — Other Ambulatory Visit: Payer: Self-pay

## 2021-06-03 MED ORDER — OMEPRAZOLE 40 MG PO CPDR: 40.0000 mg | DELAYED_RELEASE_CAPSULE | Freq: Two times a day (BID) | ORAL | 1 refills | Status: DC

## 2021-06-04 NOTE — Progress Notes (Signed)
Rhyder Koegel - 50 y.o. male MRN [id number]  Date of birth: 1970/08/15  Office Visit Note: Visit Date: 05/23/2021 PCP: Ronnald Nian, MD Referred by: Ronnald Nian, MD  Subjective: Chief Complaint  Patient presents with   Lower Back - Pain   Right Leg - Pain   HPI:  Trystyn Dolley is a 50 y.o. male who comes in today at the request of Ellin Goodie, FNP for planned Bilateral  L5-S1 Lumbar facet/medial branch block with fluoroscopic guidance.  The patient has failed conservative care including home exercise, medications, time and activity modification.  This injection will be diagnostic and hopefully therapeutic.  Please see requesting physician notes for further details and justification.  Exam has shown concordant pain with facet joint loading.   ROS Otherwise per HPI.  Assessment & Plan: Visit Diagnoses:    ICD-10-CM   1. Chronic bilateral low back pain without sciatica  M54.50 XR C-ARM NO REPORT   G89.29 Facet Injection    methylPREDNISolone acetate (DEPO-MEDROL) injection 80 mg    DISCONTINUED: bupivacaine (MARCAINE) 0.25 % (with pres) injection 2 mL    2. Spondylosis without myelopathy or radiculopathy, lumbar region  M47.816 XR C-ARM NO REPORT    Facet Injection    methylPREDNISolone acetate (DEPO-MEDROL) injection 80 mg    DISCONTINUED: bupivacaine (MARCAINE) 0.25 % (with pres) injection 2 mL      Plan: No additional findings.   Meds & Orders:  Meds ordered this encounter  Medications   DISCONTD: bupivacaine (MARCAINE) 0.25 % (with pres) injection 2 mL   methylPREDNISolone acetate (DEPO-MEDROL) injection 80 mg    Orders Placed This Encounter  Procedures   Facet Injection   XR C-ARM NO REPORT    Follow-up: Return if symptoms worsen or fail to improve.   Procedures: No procedures performed  Lumbar Facet Joint Intra-Articular Injection(s) with Fluoroscopic Guidance  Patient: Izak Anding      Date of Birth: 1970-12-06 MRN: [id number] PCP:  Ronnald Nian, MD      Visit Date: 05/23/2021   Universal Protocol:    Date/Time: 05/23/2021  Consent Given By: the patient  Position: PRONE   Additional Comments: Vital signs were monitored before and after the procedure. Patient was prepped and draped in the usual sterile fashion. The correct patient, procedure, and site was verified.   Injection Procedure Details:  Procedure Site One Meds Administered:  Meds ordered this encounter  Medications   bupivacaine (MARCAINE) 0.25 % (with pres) injection 2 mL   methylPREDNISolone acetate (DEPO-MEDROL) injection 80 mg     Laterality: Bilateral  Location/Site:  L5-S1  Needle size: 22 guage  Needle type: Spinal  Needle Placement: Articular  Findings:  -Comments: Excellent flow of contrast producing a partial arthrogram.  Procedure Details: The fluoroscope beam is vertically oriented in AP, and the inferior recess is visualized beneath the lower pole of the inferior apophyseal process, which represents the target point for needle insertion. When direct visualization is difficult the target point is located at the medial projection of the vertebral pedicle. The region overlying each aforementioned target is locally anesthetized with a 1 to 2 ml. volume of 1% Lidocaine without Epinephrine.   The spinal needle was inserted into each of the above mentioned facet joints using biplanar fluoroscopic guidance. A 0.25 to 0.5 ml. volume of Isovue-250 was injected and a partial facet joint arthrogram was obtained. A single spot film was obtained of the resulting arthrogram.    One to 1.25  ml of the steroid/anesthetic solution was then injected into each of the facet joints noted above.   Additional Comments:  The patient tolerated the procedure well Dressing: 2 x 2 sterile gauze and Band-Aid    Post-procedure details: Patient was observed during the procedure. Post-procedure instructions were reviewed.  Patient left the clinic in  stable condition.     Clinical History: Rachel Moulds, DO - 04/13/2020  Formatting of this note might be different from the original.  MRI lumbar spine:   TECHNIQUE: Sagittal and axial T1 and T2-weighted sequences were performed. Additional sagittal STIR images were performed.   INDICATION: Lumbar strain   COMPARISON: None   FINDINGS:  #  Lumbar alignment is preserved.  #  Vertebral body heights are well maintained.  #  The marrow signal intensity is normal.  #  Conus terminates at T12-L1 without evidence of tethering.  #  Nerve roots appear normal.  #  Incidental findings: None.    #  L1-2: Normal.  #    #  L2-3: Normal.  #    #  L3-4: Normal.  #    #  L4-5: Normal.  #    #  L5-S1: Mild degenerative disc disease. There is facet arthropathy and rightward asymmetric disc bulging causing lateral recess stenosis with mild right neuroforaminal narrowing. No significant central canal narrowing.    IMPRESSION:   Lumbar spondylosis L5-S1 with rightward asymmetric disc bulging causing right lateral recess stenosis with mild right neuroforaminal narrowing. No significant central canal stenosis.   Electronically Signed by: Abner Greenspan Specimen Collected: 04/13/20 11:51 AM    ------------ MRI LUMBAR SPINE WITHOUT CONTRAST     TECHNIQUE:  Multiplanar, multisequence MR imaging of the lumbar spine was  performed. No intravenous contrast was administered.     COMPARISON:  Radiography 02/01/2018     FINDINGS:  Segmentation:  5 lumbar type vertebral bodies.     Alignment:  Mild dextrocurvature     Vertebrae:  No fracture, evidence of discitis, or bone lesion.     Conus medullaris and cauda equina: Conus extends to the L1-2 level.  Conus and cauda equina appear normal.     Paraspinal and other soft tissues: Negative     Disc levels:     T12- L1: Unremarkable.     L1-L2: Unremarkable.     L2-L3: Unremarkable.     L3-L4: Unremarkable.     L4-L5:  Unremarkable.     L5-S1:Disc narrowing worse towards the right. There is relatively  mild disc bulging and far-lateral spurring. Negative facets. No  impingement     IMPRESSION:  1. 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  2. Mild dextroscoliosis.        Electronically Signed    By: Marnee Spring M.D.    On: 03/29/2018 14:39     Objective:  VS:  HT:    WT:   BMI:     BP:118/78  HR:78bpm  TEMP: ( )  RESP:  Physical Exam Vitals and nursing note reviewed.  Constitutional:      General: He is not in acute distress.    Appearance: Normal appearance. He is not ill-appearing.  HENT:     Head: Normocephalic and atraumatic.     Right Ear: External ear normal.     Left Ear: External ear normal.     Nose: No congestion.  Eyes:     Extraocular Movements: Extraocular movements intact.  Cardiovascular:     Rate and  Rhythm: Normal rate.     Pulses: Normal pulses.  Pulmonary:     Effort: Pulmonary effort is normal. No respiratory distress.  Abdominal:     General: There is no distension.     Palpations: Abdomen is soft.  Musculoskeletal:        General: No tenderness or signs of injury.     Cervical back: Neck supple.     Right lower leg: No edema.     Left lower leg: No edema.     Comments: Patient has good distal strength without clonus. Patient somewhat slow to rise from a seated position to full extension.  There is concordant low back pain with facet loading and lumbar spine extension rotation.  There are no definitive trigger points but the patient is somewhat tender across the lower back and PSIS.  There is no pain with hip rotation.   Skin:    Findings: No erythema or rash.  Neurological:     General: No focal deficit present.     Mental Status: He is alert and oriented to person, place, and time.     Sensory: No sensory deficit.     Motor: No weakness or abnormal muscle tone.     Coordination: Coordination normal.  Psychiatric:        Mood and Affect:  Mood normal.        Behavior: Behavior normal.     Imaging: No results found.

## 2021-06-04 NOTE — Procedures (Signed)
Lumbar Facet Joint Intra-Articular Injection(s) with Fluoroscopic Guidance  Patient: Samuel Duncan      Date of Birth: 1971/07/24 MRN: [id number] PCP: Ronnald Nian, MD      Visit Date: 05/23/2021   Universal Protocol:    Date/Time: 05/23/2021  Consent Given By: the patient  Position: PRONE   Additional Comments: Vital signs were monitored before and after the procedure. Patient was prepped and draped in the usual sterile fashion. The correct patient, procedure, and site was verified.   Injection Procedure Details:  Procedure Site One Meds Administered:  Meds ordered this encounter  Medications   bupivacaine (MARCAINE) 0.25 % (with pres) injection 2 mL   methylPREDNISolone acetate (DEPO-MEDROL) injection 80 mg     Laterality: Bilateral  Location/Site:  L5-S1  Needle size: 22 guage  Needle type: Spinal  Needle Placement: Articular  Findings:  -Comments: Excellent flow of contrast producing a partial arthrogram.  Procedure Details: The fluoroscope beam is vertically oriented in AP, and the inferior recess is visualized beneath the lower pole of the inferior apophyseal process, which represents the target point for needle insertion. When direct visualization is difficult the target point is located at the medial projection of the vertebral pedicle. The region overlying each aforementioned target is locally anesthetized with a 1 to 2 ml. volume of 1% Lidocaine without Epinephrine.   The spinal needle was inserted into each of the above mentioned facet joints using biplanar fluoroscopic guidance. A 0.25 to 0.5 ml. volume of Isovue-250 was injected and a partial facet joint arthrogram was obtained. A single spot film was obtained of the resulting arthrogram.    One to 1.25 ml of the steroid/anesthetic solution was then injected into each of the facet joints noted above.   Additional Comments:  The patient tolerated the procedure well Dressing: 2 x 2 sterile gauze  and Band-Aid    Post-procedure details: Patient was observed during the procedure. Post-procedure instructions were reviewed.  Patient left the clinic in stable condition.

## 2021-06-21 ENCOUNTER — Telehealth: Payer: Self-pay | Admitting: Physical Medicine and Rehabilitation

## 2021-06-21 DIAGNOSIS — M5416 Radiculopathy, lumbar region: Secondary | ICD-10-CM

## 2021-06-21 NOTE — Telephone Encounter (Signed)
Pt called requesting a call back concerning his injections a couple weeks ago. Pt stats injections did help with discomfort but having pains down his legs. Please call pt about this matter to figure out if appt is needed or next steps. Pt phone number is [phone].

## 2021-06-21 NOTE — Telephone Encounter (Signed)
Bilateral L5-S1 facets on 10/20. Please advise.

## 2021-06-27 ENCOUNTER — Other Ambulatory Visit: Payer: Self-pay | Admitting: Family Medicine

## 2021-07-04 ENCOUNTER — Ambulatory Visit: Payer: 59 | Admitting: Family Medicine

## 2021-07-04 ENCOUNTER — Other Ambulatory Visit: Payer: Self-pay

## 2021-07-04 ENCOUNTER — Encounter: Payer: Self-pay | Admitting: Family Medicine

## 2021-07-04 VITALS — BP 110/70 | HR 71 | Temp 97.5°F | Ht 71.0 in | Wt 171.4 lb

## 2021-07-04 DIAGNOSIS — K219 Gastro-esophageal reflux disease without esophagitis: Secondary | ICD-10-CM | POA: Diagnosis not present

## 2021-07-04 DIAGNOSIS — N433 Hydrocele, unspecified: Secondary | ICD-10-CM

## 2021-07-04 DIAGNOSIS — M5137 Other intervertebral disc degeneration, lumbosacral region: Secondary | ICD-10-CM

## 2021-07-04 DIAGNOSIS — M545 Low back pain, unspecified: Secondary | ICD-10-CM

## 2021-07-04 DIAGNOSIS — G8929 Other chronic pain: Secondary | ICD-10-CM

## 2021-07-04 DIAGNOSIS — L729 Follicular cyst of the skin and subcutaneous tissue, unspecified: Secondary | ICD-10-CM

## 2021-07-04 DIAGNOSIS — Z0000 Encounter for general adult medical examination without abnormal findings: Secondary | ICD-10-CM | POA: Diagnosis not present

## 2021-07-04 DIAGNOSIS — Z1159 Encounter for screening for other viral diseases: Secondary | ICD-10-CM

## 2021-07-04 NOTE — Progress Notes (Signed)
   Subjective:    Patient ID: Samuel Duncan, male    DOB: 1971-04-10, 50 y.o.   MRN: [id number]  HPI He is here for complete examination.  He continues to be taken care of by Dr. Alvester Morin for his chronic low back pain and is getting good results with the epidural injections.  He does have reflux and is using Prilosec fairly regularly.  He also has a question about a lesion on his scrotum and also a mass on the left testicular area.  Otherwise he has no particular concerns or complaints.  He does not smoke or drink.  His work and home life are going quite well.  He is involved with a young lady and might at some point down the road get a vasectomy.  otherwise his family and social history as well as health maintenance immunizations was reviewed.   Review of Systems  All other systems reviewed and are negative.     Objective:   Physical Exam Alert and in no distress. Tympanic membranes and canals are normal. Pharyngeal area is normal. Neck is supple without adenopathy or thyromegaly. Cardiac exam shows a regular sinus rhythm without murmurs or gallops. Lungs are clear to auscultation.  Abdominal exam shows no masses or tenderness.  Genitalia does show a cyst present on the left side of the scrotum.  Also he has a scrotal lesion that did transilluminate.        Assessment & Plan:  Routine general medical examination at a health care facility  Chronic midline low back pain without sciatica  DDD (degenerative disc disease), lumbosacral  Chronic GERD  Hydrocele, left  Scrotal cyst I reassured him that the scrotal cyst is benign as well as the hydrocele.  No follow-up on either unless he starts having difficulty.  He will continue on his Prilosec.  Continue to be followed by Dr. Alvester Morin for his chronic back pain.

## 2021-07-05 LAB — COMPREHENSIVE METABOLIC PANEL
ALT: 21 IU/L (ref 0–44)
AST: 26 IU/L (ref 0–40)
Albumin/Globulin Ratio: 1.8 (ref 1.2–2.2)
Albumin: 5 g/dL — ABNORMAL HIGH (ref 3.8–4.9)
Alkaline Phosphatase: 112 IU/L (ref 44–121)
BUN/Creatinine Ratio: 10 (ref 9–20)
BUN: 9 mg/dL (ref 6–24)
Bilirubin Total: 0.6 mg/dL (ref 0.0–1.2)
CO2: 23 mmol/L (ref 20–29)
Calcium: 9.7 mg/dL (ref 8.7–10.2)
Chloride: 102 mmol/L (ref 96–106)
Creatinine, Ser: 0.93 mg/dL (ref 0.76–1.27)
Globulin, Total: 2.8 g/dL (ref 1.5–4.5)
Glucose: 92 mg/dL (ref 70–99)
Potassium: 4.1 mmol/L (ref 3.5–5.2)
Sodium: 141 mmol/L (ref 134–144)
Total Protein: 7.8 g/dL (ref 6.0–8.5)
eGFR: 99 mL/min/{1.73_m2} (ref >59)

## 2021-07-05 LAB — CBC WITH DIFFERENTIAL/PLATELET
Basophils Absolute: 0.1 10*3/uL (ref 0.0–0.2)
Basos: 1 %
EOS (ABSOLUTE): 0.2 10*3/uL (ref 0.0–0.4)
Eos: 2 %
Hematocrit: 46.5 % (ref 37.5–51.0)
Hemoglobin: 16 g/dL (ref 13.0–17.7)
Immature Grans (Abs): 0 10*3/uL (ref 0.0–0.1)
Immature Granulocytes: 0 %
Lymphocytes Absolute: 2.2 10*3/uL (ref 0.7–3.1)
Lymphs: 27 %
MCH: 29.8 pg (ref 26.6–33.0)
MCHC: 34.4 g/dL (ref 31.5–35.7)
MCV: 87 fL (ref 79–97)
Monocytes Absolute: 0.5 10*3/uL (ref 0.1–0.9)
Monocytes: 6 %
Neutrophils Absolute: 5.1 10*3/uL (ref 1.4–7.0)
Neutrophils: 64 %
Platelets: 274 10*3/uL (ref 150–450)
RBC: 5.37 x10E6/uL (ref 4.14–5.80)
RDW: 12.4 % (ref 11.6–15.4)
WBC: 8 10*3/uL (ref 3.4–10.8)

## 2021-07-05 LAB — HEPATITIS C ANTIBODY: Hep C Virus Ab: 0.1 s/co ratio (ref 0.0–0.9)

## 2021-07-05 LAB — LIPID PANEL
Chol/HDL Ratio: 3.8 ratio (ref 0.0–5.0)
Cholesterol, Total: 169 mg/dL (ref 100–199)
HDL: 45 mg/dL (ref >39)
LDL Chol Calc (NIH): 97 mg/dL (ref 0–99)
Triglycerides: 155 mg/dL — ABNORMAL HIGH (ref 0–149)
VLDL Cholesterol Cal: 27 mg/dL (ref 5–40)

## 2021-07-15 ENCOUNTER — Ambulatory Visit: Payer: Self-pay

## 2021-07-15 ENCOUNTER — Ambulatory Visit (INDEPENDENT_AMBULATORY_CARE_PROVIDER_SITE_OTHER): Payer: 59 | Admitting: Physical Medicine and Rehabilitation

## 2021-07-15 ENCOUNTER — Encounter: Payer: Self-pay | Admitting: Physical Medicine and Rehabilitation

## 2021-07-15 ENCOUNTER — Other Ambulatory Visit: Payer: Self-pay

## 2021-07-15 DIAGNOSIS — M5416 Radiculopathy, lumbar region: Secondary | ICD-10-CM

## 2021-07-15 MED ORDER — METHYLPREDNISOLONE ACETATE 80 MG/ML IJ SUSP
80.0000 mg | Freq: Once | INTRAMUSCULAR | Status: AC
  Administered 2021-07-15: 80 mg

## 2021-07-15 NOTE — Progress Notes (Signed)
Pt state lower back pain that travels to his buttocks and right thigh. Pt state laying down, walking and bending over makes the pain worse. Pt state he takes pain meds to help ease his pain. Pt as hx of inj on 05/23/21 pt state it helped.  Numeric Pain Rating Scale and Functional Assessment Average Pain 6   In the last MONTH (on 0-10 scale) has pain interfered with the following?  1. General activity like being  able to carry out your everyday physical activities such as walking, climbing stairs, carrying groceries, or moving a chair?  Rating(10)   +Driver, -BT, -Dye Allergies.

## 2021-07-15 NOTE — Patient Instructions (Signed)

## 2021-07-16 NOTE — Progress Notes (Signed)
Samuel Duncan - 50 y.o. male MRN [id number]  Date of birth: 1971/01/24  Office Visit Note: Visit Date: 07/15/2021 PCP: Ronnald Nian, MD Referred by: Ronnald Nian, MD  Subjective: Chief Complaint  Patient presents with   Lower Back - Pain   Right Thigh - Pain   HPI:  Samuel Duncan is a 50 y.o. male who comes in today at the request of Ellin Goodie, FNP for planned Right S1-2 Lumbar Transforaminal epidural steroid injection with fluoroscopic guidance.  The patient has failed conservative care including home exercise, medications, time and activity modification.  This injection will be diagnostic and hopefully therapeutic.  Please see requesting physician notes for further details and justification.   ROS Otherwise per HPI.  Assessment & Plan: Visit Diagnoses:    ICD-10-CM   1. Lumbar radiculopathy  M54.16 XR C-ARM NO REPORT    Epidural Steroid injection    methylPREDNISolone acetate (DEPO-MEDROL) injection 80 mg      Plan: No additional findings.   Meds & Orders:  Meds ordered this encounter  Medications   methylPREDNISolone acetate (DEPO-MEDROL) injection 80 mg    Orders Placed This Encounter  Procedures   XR C-ARM NO REPORT   Epidural Steroid injection    Follow-up: Return if symptoms worsen or fail to improve.   Procedures: No procedures performed  S1 Lumbosacral Transforaminal Epidural Steroid Injection - Sub-Pedicular Approach with Fluoroscopic Guidance   Patient: Samuel Duncan      Date of Birth: [birth year] MRN: [id number] PCP: Ronnald Nian, MD      Visit Date: 07/15/2021   Universal Protocol:    Date/Time: 07/17/2211:32 PM  Consent Given By: the patient  Position:  PRONE  Additional Comments: Vital signs were monitored before and after the procedure. Patient was prepped and draped in the usual sterile fashion. The correct patient, procedure, and site was verified.   Injection Procedure Details:  Procedure Site One Meds  Administered:  Meds ordered this encounter  Medications   methylPREDNISolone acetate (DEPO-MEDROL) injection 80 mg    Laterality: Right  Location/Site:  S1 Foramen   Needle size: 22 ga.  Needle type: Spinal  Needle Placement: Transforaminal  Findings:   -Comments: Excellent flow of contrast along the nerve, nerve root and into the epidural space.  Epidurogram: Contrast epidurogram showed no nerve root cut off or restricted flow pattern.  Procedure Details: After squaring off the sacral end-plate to get a true AP view, the C-arm was positioned so that the best possible view of the S1 foramen was visualized. The soft tissues overlying this structure were infiltrated with 2-3 ml. of 1% Lidocaine without Epinephrine.    The spinal needle was inserted toward the target using a "trajectory" view along the fluoroscope beam.  Under AP and lateral visualization, the needle was advanced so it did not puncture dura. Biplanar projections were used to confirm position. Aspiration was confirmed to be negative for CSF and/or blood. A 1-2 ml. volume of Isovue-250 was injected and flow of contrast was noted at each level. Radiographs were obtained for documentation purposes.   After attaining the desired flow of contrast documented above, a 0.5 to 1.0 ml test dose of 0.25% Marcaine was injected into each respective transforaminal space.  The patient was observed for 90 seconds post injection.  After no sensory deficits were reported, and normal lower extremity motor function was noted,   the above injectate was administered so that equal amounts of the injectate were placed  at each foramen (level) into the transforaminal epidural space.   Additional Comments:  No complications occurred Dressing: Band-Aid with 2 x 2 sterile gauze    Post-procedure details: Patient was observed during the procedure. Post-procedure instructions were reviewed.  Patient left the clinic in stable condition.    Clinical History: Rachel Moulds, DO - 04/13/2020  Formatting of this note might be different from the original.  MRI lumbar spine:   TECHNIQUE: Sagittal and axial T1 and T2-weighted sequences were performed. Additional sagittal STIR images were performed.   INDICATION: Lumbar strain   COMPARISON: None   FINDINGS:  #  Lumbar alignment is preserved.  #  Vertebral body heights are well maintained.  #  The marrow signal intensity is normal.  #  Conus terminates at T12-L1 without evidence of tethering.  #  Nerve roots appear normal.  #  Incidental findings: None.    #  L1-2: Normal.  #    #  L2-3: Normal.  #    #  L3-4: Normal.  #    #  L4-5: Normal.  #    #  L5-S1: Mild degenerative disc disease. There is facet arthropathy and rightward asymmetric disc bulging causing lateral recess stenosis with mild right neuroforaminal narrowing. No significant central canal narrowing.    IMPRESSION:   Lumbar spondylosis L5-S1 with rightward asymmetric disc bulging causing right lateral recess stenosis with mild right neuroforaminal narrowing. No significant central canal stenosis.   Electronically Signed by: Abner Greenspan Specimen Collected: 04/13/20 11:51 AM    ------------ MRI LUMBAR SPINE WITHOUT CONTRAST     TECHNIQUE:  Multiplanar, multisequence MR imaging of the lumbar spine was  performed. No intravenous contrast was administered.     COMPARISON:  Radiography 02/01/2018     FINDINGS:  Segmentation:  5 lumbar type vertebral bodies.     Alignment:  Mild dextrocurvature     Vertebrae:  No fracture, evidence of discitis, or bone lesion.     Conus medullaris and cauda equina: Conus extends to the L1-2 level.  Conus and cauda equina appear normal.     Paraspinal and other soft tissues: Negative     Disc levels:     T12- L1: Unremarkable.     L1-L2: Unremarkable.     L2-L3: Unremarkable.     L3-L4: Unremarkable.     L4-L5: Unremarkable.     L5-S1:Disc  narrowing worse towards the right. There is relatively  mild disc bulging and far-lateral spurring. Negative facets. No  impingement     IMPRESSION:  1. 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  2. Mild dextroscoliosis.        Electronically Signed    By: Marnee Spring M.D.    On: 03/29/2018 14:39     Objective:  VS:  HT:     WT:    BMI:      BP:    HR: bpm   TEMP: ( )   RESP:  Physical Exam Vitals and nursing note reviewed.  Constitutional:      General: He is not in acute distress.    Appearance: Normal appearance. He is not ill-appearing.  HENT:     Head: Normocephalic and atraumatic.     Right Ear: External ear normal.     Left Ear: External ear normal.     Nose: No congestion.  Eyes:     Extraocular Movements: Extraocular movements intact.  Cardiovascular:     Rate and Rhythm: Normal rate.  Pulses: Normal pulses.  Pulmonary:     Effort: Pulmonary effort is normal. No respiratory distress.  Abdominal:     General: There is no distension.     Palpations: Abdomen is soft.  Musculoskeletal:        General: No tenderness or signs of injury.     Cervical back: Neck supple.     Right lower leg: No edema.     Left lower leg: No edema.     Comments: Patient has good distal strength without clonus.  Skin:    Findings: No erythema or rash.  Neurological:     General: No focal deficit present.     Mental Status: He is alert and oriented to person, place, and time.     Sensory: No sensory deficit.     Motor: No weakness or abnormal muscle tone.     Coordination: Coordination normal.  Psychiatric:        Mood and Affect: Mood normal.        Behavior: Behavior normal.     Imaging: XR C-ARM NO REPORT  Result Date: 07/15/2021 Please see Notes tab for imaging impression.

## 2021-07-16 NOTE — Procedures (Signed)
S1 Lumbosacral Transforaminal Epidural Steroid Injection - Sub-Pedicular Approach with Fluoroscopic Guidance   Patient: Samuel Duncan      Date of Birth: 05-05-1971 MRN: [id number] PCP: Ronnald Nian, MD      Visit Date: 07/15/2021   Universal Protocol:    Date/Time: 07/17/2211:32 PM  Consent Given By: the patient  Position:  PRONE  Additional Comments: Vital signs were monitored before and after the procedure. Patient was prepped and draped in the usual sterile fashion. The correct patient, procedure, and site was verified.   Injection Procedure Details:  Procedure Site One Meds Administered:  Meds ordered this encounter  Medications   methylPREDNISolone acetate (DEPO-MEDROL) injection 80 mg    Laterality: Right  Location/Site:  S1 Foramen   Needle size: 22 ga.  Needle type: Spinal  Needle Placement: Transforaminal  Findings:   -Comments: Excellent flow of contrast along the nerve, nerve root and into the epidural space.  Epidurogram: Contrast epidurogram showed no nerve root cut off or restricted flow pattern.  Procedure Details: After squaring off the sacral end-plate to get a true AP view, the C-arm was positioned so that the best possible view of the S1 foramen was visualized. The soft tissues overlying this structure were infiltrated with 2-3 ml. of 1% Lidocaine without Epinephrine.    The spinal needle was inserted toward the target using a "trajectory" view along the fluoroscope beam.  Under AP and lateral visualization, the needle was advanced so it did not puncture dura. Biplanar projections were used to confirm position. Aspiration was confirmed to be negative for CSF and/or blood. A 1-2 ml. volume of Isovue-250 was injected and flow of contrast was noted at each level. Radiographs were obtained for documentation purposes.   After attaining the desired flow of contrast documented above, a 0.5 to 1.0 ml test dose of 0.25% Marcaine was injected into  each respective transforaminal space.  The patient was observed for 90 seconds post injection.  After no sensory deficits were reported, and normal lower extremity motor function was noted,   the above injectate was administered so that equal amounts of the injectate were placed at each foramen (level) into the transforaminal epidural space.   Additional Comments:  No complications occurred Dressing: Band-Aid with 2 x 2 sterile gauze    Post-procedure details: Patient was observed during the procedure. Post-procedure instructions were reviewed.  Patient left the clinic in stable condition.

## 2021-07-27 ENCOUNTER — Other Ambulatory Visit: Payer: Self-pay | Admitting: Family Medicine

## 2021-07-30 ENCOUNTER — Other Ambulatory Visit: Payer: Self-pay

## 2021-07-30 ENCOUNTER — Telehealth (INDEPENDENT_AMBULATORY_CARE_PROVIDER_SITE_OTHER): Payer: 59 | Admitting: Medical

## 2021-07-30 ENCOUNTER — Encounter: Payer: Self-pay | Admitting: Medical

## 2021-07-30 VITALS — Wt 171.0 lb

## 2021-07-30 DIAGNOSIS — J0110 Acute frontal sinusitis, unspecified: Secondary | ICD-10-CM

## 2021-07-30 DIAGNOSIS — R051 Acute cough: Secondary | ICD-10-CM

## 2021-07-30 MED ORDER — AMOXICILLIN 875 MG PO TABS: 875.0000 mg | ORAL_TABLET | Freq: Two times a day (BID) | ORAL | 0 refills | Status: AC

## 2021-07-30 NOTE — Progress Notes (Signed)
°  Subjective:     Patient ID: Samuel Duncan, male   DOB: 09-04-1970, 50 y.o.   MRN: [id number]  This visit type was conducted due to national recommendations for restrictions regarding the COVID-19 Pandemic (e.g. social distancing) in an effort to limit this patient's exposure and mitigate transmission in our community.  Due to their co-morbid illnesses, this patient is at least at moderate risk for complications without adequate follow up.  This format is felt to be most appropriate for this patient at this time.    Documentation for virtual audio and video telecommunications through [facility] encounter:  The patient was located at home. The provider was located in the office. The patient did consent to this visit and is aware of possible charges through their insurance for this visit.  The other persons participating in this telemedicine service were none. Time spent on call was 20 minutes and in review of previous records 20 minutes total.  This virtual service is not related to other E/M service within previous 7 days.   HPI Chief Complaint  Patient presents with   Sinus Problem    Sinus pressure, headache, coughing from drainage. Has not checked temperature, felt warm last night    Virtual consult for illness.  He reports sick contacts with his grandkids.  They are in daycare. 1 grandchild was diagnosed with sinus infection.  Grandkids recently tested negative for COVID and flu.  He notes sinus pressure, sinus congestion, headache, coughing, drainage, has had low-grade fever, no sore throat no chills or body aches.  No nausea vomiting diarrhea.  No shortness of breath or wheezing.  No loss of taste or smell. Using some cold and flu medication.   No other aggravating or relieving factors. No other complaint.      Past Medical History:  Diagnosis Date   GERD (gastroesophageal reflux disease) 03/17/2016   [facility] ENT Notes    Current Outpatient Medications on File Prior to  Visit  Medication Sig Dispense Refill   omeprazole (PRILOSEC) 40 MG capsule TAKE 1 CAPSULE BY MOUTH TWICE A DAY 60 capsule 1   methocarbamol (ROBAXIN) 500 MG tablet Take 1 tablet (500 mg total) by mouth 4 (four) times daily. (Patient not taking: Reported on 07/30/2021) 30 tablet 1   No current facility-administered medications on file prior to visit.     Review of Systems As in subjective    Objective:   Physical Exam Due to coronavirus pandemic stay at home measures, patient visit was virtual and they were not examined in person.   Wt 171 lb (77.6 kg)    BMI 23.85 kg/m   Gen: wd, wn, nad, white male No labored breathing or obvious wheezing      Assessment:     Encounter Diagnoses  Name Primary?   Acute non-recurrent frontal sinusitis Yes   Acute cough        Plan:     Discussed limitations of virtual consult.  Begin medication below.  Advised rest, hydration, continue supportive measures.  If not much improved in the next 72 hours then call or recheck  Samuel Duncan was seen today for sinus problem.  Diagnoses and all orders for this visit:  Acute non-recurrent frontal sinusitis  Acute cough  Other orders -     amoxicillin (AMOXIL) 875 MG tablet; Take 1 tablet (875 mg total) by mouth 2 (two) times daily for 10 days.  F/u prn

## 2021-08-21 ENCOUNTER — Other Ambulatory Visit: Payer: Self-pay | Admitting: Family Medicine

## 2021-09-25 ENCOUNTER — Other Ambulatory Visit: Payer: Self-pay | Admitting: Family Medicine

## 2021-11-27 ENCOUNTER — Telehealth: Payer: Self-pay | Admitting: Physical Medicine and Rehabilitation

## 2021-11-27 NOTE — Telephone Encounter (Signed)
Pt called requesting a call back to set an appt for back injection. Please call pt [phone] ?

## 2021-12-10 ENCOUNTER — Telehealth: Payer: Self-pay | Admitting: Physical Medicine and Rehabilitation

## 2021-12-10 NOTE — Telephone Encounter (Signed)
Pt called requesting a call back to set an appt from referral. Please call pt at [phone]. ?

## 2022-01-09 ENCOUNTER — Ambulatory Visit (INDEPENDENT_AMBULATORY_CARE_PROVIDER_SITE_OTHER): Payer: 59 | Admitting: Physical Medicine and Rehabilitation

## 2022-01-09 ENCOUNTER — Encounter: Payer: Self-pay | Admitting: Physical Medicine and Rehabilitation

## 2022-01-09 ENCOUNTER — Ambulatory Visit: Payer: Self-pay

## 2022-01-09 VITALS — BP 135/83 | HR 69

## 2022-01-09 DIAGNOSIS — M5416 Radiculopathy, lumbar region: Secondary | ICD-10-CM | POA: Diagnosis not present

## 2022-01-09 MED ORDER — METHYLPREDNISOLONE ACETATE 80 MG/ML IJ SUSP
80.0000 mg | Freq: Once | INTRAMUSCULAR | Status: AC
  Administered 2022-01-09: 80 mg

## 2022-01-09 NOTE — Progress Notes (Unsigned)
Pt state lower back pain that travels to his buttocks and right thigh. Pt state laying down, walking and bending over makes the pain worse. Pt state he takes pain meds to help ease his pain.  Numeric Pain Rating Scale and Functional Assessment Average Pain 5   In the last MONTH (on 0-10 scale) has pain interfered with the following?  1. General activity like being  able to carry out your everyday physical activities such as walking, climbing stairs, carrying groceries, or moving a chair?  Rating(9)   +Driver, -BT, -Dye Allergies.

## 2022-01-09 NOTE — Patient Instructions (Signed)

## 2022-03-27 ENCOUNTER — Encounter: Payer: Self-pay | Admitting: Family Medicine

## 2022-03-27 ENCOUNTER — Telehealth: Payer: 59 | Admitting: Family Medicine

## 2022-03-27 VITALS — BP 130/80 | Ht 72.0 in | Wt 174.0 lb

## 2022-03-27 DIAGNOSIS — J069 Acute upper respiratory infection, unspecified: Secondary | ICD-10-CM

## 2022-03-27 NOTE — Progress Notes (Signed)
Start time: 11:59 End time: 12:16  Virtual Visit via Video Note  I connected with Samuel Duncan on 03/27/22 by a video enabled telemedicine application and verified that I am speaking with the correct person using two identifiers.  Location: Patient: parked car Provider: office   I discussed the limitations of evaluation and management by telemedicine and the availability of in person appointments. The patient expressed understanding and agreed to proceed.  History of Present Illness:  Chief Complaint  Patient presents with   Acute Visit    Virtual- Grand kids were sick over the weekend and have a sinus infection. He has facial pressure, sinus drainage.   Over the weekend he was at a birthday party for his grandchild. Grandkids got sick, and he got sick from them.  They went to the doctor, told sinus infections.   Sunday night (8/20) he started with a headache.  Monday he had pain across his forehead, behind his eyes, and in his cheeks. Some PND--phlegm is clear. 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 Only small amount of mucus otherwise.  He has taken some OTC sinus medicine (decongestant)--helps ease it off.  PMH, PSH, SH reviewed  Hasn't had any COVID vaccines.  Outpatient Encounter Medications as of 03/27/2022  Medication Sig   omeprazole (PRILOSEC) 40 MG capsule TAKE (1) CAPSULE BY MOUTH TWICE DAILY.   methocarbamol (ROBAXIN) 500 MG tablet Take 1 tablet (500 mg total) by mouth 4 (four) times daily. (Patient not taking: Reported on 03/27/2022)   No facility-administered encounter medications on file as of 03/27/2022.   No Known Allergies  ROS:  No f/c, no n/v/d, on bleeding/bruising, rash. No chest pain, shortness of breath. See HPI   Observations/Objective:  BP 130/80   Ht 6' (1.829 m)   Wt 174 lb (78.9 kg)   BMI 23.60 kg/m   Pleasant, well-appearing male in no distress. He is alert and oriented. Cranial  nerves grossly intact. No sniffling, throat-clearing or coughing during visit. Exam is limited by virtual nature of the visit.   Assessment and Plan:  Viral URI - supportive measures discussed. Rec home COVID test--candidate for Paxlovid (unvacc), on day 5. S/sx bacterial infection reviewed  COVID home test rec, since unvaccinated, on day 5 of sx If +, he is a candidate for paxlovid, and should contact us for rx.   Drink plenty of water.  Try taking the decongestant more regularly (follow directions on the box). I recommend adding plain guaifenesin (vs the DM version if coughing, or get a separate Delsym syrup to use as needed for cough--Archimedes't use Delsym and mucinex DM, or you'll be doubling up on the same ingredient). Consider doing sinus rinses (neti-pot or sinus rinse kit) to help with sinus pain.  If you develop fever, persistent discolored mucus or phlegm, or other new, worsening/concerning symptoms, contact us for antibiotics.   Follow Up Instructions:    I discussed the assessment and treatment plan with the patient. The patient was provided an opportunity to ask questions and all were answered. The patient agreed with the plan and demonstrated an understanding of the instructions.   The patient was advised to call back or seek an in-person evaluation if the symptoms worsen or if the condition fails to improve as anticipated.  I spent 20 minutes dedicated to the care of this patient, including pre-visit review of records, face to face time, post-visit ordering of testing and documentation.    Shadrack Brummitt A  Tomi Bamberger, MD

## 2022-03-27 NOTE — Patient Instructions (Signed)
Drink plenty of water.  Try taking the decongestant more regularly (follow directions on the box). I recommend adding plain guaifenesin (vs the DM version if coughing, or get a separate Delsym syrup to use as needed for cough--Jensyn't use Delsym and mucinex DM, or you'll be doubling up on the same ingredient). Consider doing sinus rinses (neti-pot or sinus rinse kit) to help with sinus pain.  If you develop fever, persistent discolored mucus or phlegm, or other new, worsening/concerning symptoms, contact us for antibiotics.

## 2022-04-14 ENCOUNTER — Telehealth: Payer: Self-pay | Admitting: Family Medicine

## 2022-04-14 MED ORDER — AMOXICILLIN 500 MG PO TABS: 1000.0000 mg | ORAL_TABLET | Freq: Two times a day (BID) | ORAL | 0 refills | Status: AC

## 2022-04-14 NOTE — Telephone Encounter (Signed)
Pt called and states that he is still having some right side eye pressure/headache, and his mucous is a thick yellow color, he has been taking the recommenced rx but it has not helped, he took a Covid test after the virtual visit and is was negative, pt said he was to call back if he was not better,  Pt uses  [id number] [address], [address] - [address]

## 2022-04-14 NOTE — Telephone Encounter (Signed)
Advise pt rx sent to pharmacy. F/u if not better

## 2022-04-15 NOTE — Telephone Encounter (Signed)
Pt was advised [facility] 

## 2022-05-13 ENCOUNTER — Encounter: Payer: Self-pay | Admitting: Internal Medicine

## 2022-05-26 ENCOUNTER — Encounter: Payer: Self-pay | Admitting: Internal Medicine

## 2023-09-29 ENCOUNTER — Encounter: Payer: Self-pay | Admitting: Internal Medicine
